# Patient Record
Sex: Male | Born: 1950 | Race: White | Hispanic: No | Marital: Married | Smoking: Never smoker
Health system: Southern US, Community
[De-identification: ages and names within clinical notes are randomized; demographics above are authoritative.]

## PROBLEM LIST (undated history)

## (undated) DIAGNOSIS — F419 Anxiety disorder, unspecified: Secondary | ICD-10-CM

## (undated) HISTORY — PX: NO PAST SURGERIES: SHX2092

---

## 2012-06-04 ENCOUNTER — Other Ambulatory Visit: Payer: Self-pay | Admitting: Orthopedic Surgery

## 2012-06-04 DIAGNOSIS — M542 Cervicalgia: Secondary | ICD-10-CM

## 2012-06-05 ENCOUNTER — Encounter (HOSPITAL_COMMUNITY): Payer: Self-pay | Admitting: General Practice

## 2012-06-05 ENCOUNTER — Ambulatory Visit
Admission: RE | Admit: 2012-06-05 | Discharge: 2012-06-05 | Disposition: A | Discharge status: Home / Self Care | Payer: Managed Care, Other (non HMO) | Source: Ambulatory Visit | Attending: Orthopedic Surgery | Admitting: Orthopedic Surgery

## 2012-06-05 ENCOUNTER — Inpatient Hospital Stay (HOSPITAL_COMMUNITY)
Admission: AD | Admit: 2012-06-05 | Discharge: 2012-06-11 | DRG: 453 | Disposition: A | Discharge status: Home Health | Payer: Managed Care, Other (non HMO) | Source: Ambulatory Visit | Attending: Internal Medicine | Admitting: Internal Medicine

## 2012-06-05 DIAGNOSIS — F419 Anxiety disorder, unspecified: Secondary | ICD-10-CM | POA: Diagnosis present

## 2012-06-05 DIAGNOSIS — M4640 Discitis, unspecified, site unspecified: Secondary | ICD-10-CM | POA: Diagnosis present

## 2012-06-05 DIAGNOSIS — F411 Generalized anxiety disorder: Secondary | ICD-10-CM | POA: Diagnosis present

## 2012-06-05 DIAGNOSIS — B954 Other streptococcus as the cause of diseases classified elsewhere: Secondary | ICD-10-CM | POA: Diagnosis present

## 2012-06-05 DIAGNOSIS — M542 Cervicalgia: Secondary | ICD-10-CM | POA: Diagnosis present

## 2012-06-05 DIAGNOSIS — M5090 Cervical disc disorder, unspecified, unspecified cervical region: Principal | ICD-10-CM | POA: Diagnosis present

## 2012-06-05 DIAGNOSIS — M8618 Other acute osteomyelitis, other site: Secondary | ICD-10-CM | POA: Diagnosis present

## 2012-06-05 DIAGNOSIS — G9340 Encephalopathy, unspecified: Secondary | ICD-10-CM | POA: Diagnosis not present

## 2012-06-05 DIAGNOSIS — G061 Intraspinal abscess and granuloma: Secondary | ICD-10-CM | POA: Diagnosis present

## 2012-06-05 DIAGNOSIS — D649 Anemia, unspecified: Secondary | ICD-10-CM | POA: Diagnosis not present

## 2012-06-05 DIAGNOSIS — J95821 Acute postprocedural respiratory failure: Secondary | ICD-10-CM | POA: Diagnosis not present

## 2012-06-05 HISTORY — DX: Anxiety disorder, unspecified: F41.9

## 2012-06-05 LAB — CBC
HCT: 37.9 % — ABNORMAL LOW (ref 39.0–52.0)
Hemoglobin: 13.4 g/dL (ref 13.0–17.0)
MCHC: 35.4 g/dL (ref 30.0–36.0)
MCV: 87.7 fL (ref 78.0–100.0)
RDW: 12.6 % (ref 11.5–15.5)

## 2012-06-05 LAB — SEDIMENTATION RATE: Sed Rate: 35 mm/h — ABNORMAL HIGH (ref 0–16)

## 2012-06-05 MED ORDER — HYDROCODONE-ACETAMINOPHEN 5-325 MG PO TABS
2.0000 | ORAL_TABLET | Freq: Four times a day (QID) | ORAL | Status: DC | PRN
  Administered 2012-06-05 – 2012-06-06 (×2): 2 via ORAL
  Filled 2012-06-05 (×3): qty 2

## 2012-06-05 MED ORDER — ONDANSETRON HCL 4 MG PO TABS: 4.0000 mg | ORAL_TABLET | Freq: Four times a day (QID) | ORAL | Status: DC | PRN

## 2012-06-05 MED ORDER — ONDANSETRON HCL 4 MG/2ML IJ SOLN: 4.0000 mg | Freq: Four times a day (QID) | INTRAMUSCULAR | Status: DC | PRN

## 2012-06-05 MED ORDER — MAGNESIUM HYDROXIDE 400 MG/5ML PO SUSP
30.0000 mL | Freq: Every day | ORAL | Status: DC | PRN
  Administered 2012-06-05 – 2012-06-06 (×2): 30 mL via ORAL
  Filled 2012-06-05 (×2): qty 30

## 2012-06-05 MED ORDER — DULOXETINE HCL 30 MG PO CPEP
30.0000 mg | ORAL_CAPSULE | Freq: Every day | ORAL | Status: DC
  Administered 2012-06-06 – 2012-06-11 (×4): 30 mg via ORAL
  Filled 2012-06-05 (×7): qty 1

## 2012-06-05 MED ORDER — ACETAMINOPHEN 650 MG RE SUPP: 650.0000 mg | Freq: Four times a day (QID) | RECTAL | Status: DC | PRN

## 2012-06-05 MED ORDER — HYDROMORPHONE HCL PF 1 MG/ML IJ SOLN
1.0000 mg | INTRAMUSCULAR | Status: DC | PRN
  Administered 2012-06-07: 0.5 mg via INTRAVENOUS
  Filled 2012-06-05 (×2): qty 1

## 2012-06-05 MED ORDER — ACETAMINOPHEN 325 MG PO TABS: 650.0000 mg | ORAL_TABLET | Freq: Four times a day (QID) | ORAL | Status: DC | PRN

## 2012-06-05 MED ORDER — OXYCODONE HCL 5 MG PO TABS: 5.0000 mg | ORAL_TABLET | ORAL | Status: DC | PRN

## 2012-06-05 NOTE — H&P (Signed)
[facility] History and Physical  Patrick Richards WUJ:[id number] DOB: 08/16/1951 DOA: 06/05/2012  Referring physician: Charlesetta Shanks - [facility] PCP: Sid Falcon, MD   Chief Complaint: Neck pain  HPI:  61 year old man without any past medical history was referred from orthopedics office for neck pain and a prevertebral fluid collection on the MRI of the C-spine. The patient has had escalating neck pain for the past month. He denies any weakness in his upper extremity or lower extremities. He denies any fevers or chills. He rates the pain 10 out of 10 at times but it is relieved with analgesics  Review of Systems:  As per history of present illness, all other systems reviewed and negative  Past Medical History  Diagnosis Date  . Anxiety     "situational"   Past Surgical History  Procedure Date  . No past surgeries    Social History:  reports that he has never smoked. He has never used smokeless tobacco. He reports that he drinks about 2.4 ounces of alcohol per week. He reports that he does not use illicit drugs. Patient lives with his wife, works  No Known Allergies  No significant family history  Prior to Admission medications   Medication Sig Start Date End Date Taking? Authorizing Provider  DULoxetine (CYMBALTA) 30 MG capsule Take 30 mg by mouth daily.   Yes Historical Provider, MD  HYDROcodone-acetaminophen (NORCO/VICODIN) 5-325 MG per tablet Take 1 tablet by mouth every 6 (six) hours as needed. For pain   Yes Historical Provider, MD   Physical Exam: Filed Vitals:   06/05/12 1649  BP: 163/92  Pulse: 94  Temp: 98.6 F (37 C)  TempSrc: Oral  Resp: 18  SpO2: 100%     General:  Alert and oriented x3  Eyes: Pupil equal round react to light accommodation  ENT: No pharyngeal erythema  Neck: mild cervical  tenderness  Cardiovascular: Regular rate and rhythm  Respiratory: Clear to auscultation bilaterally  Abdomen: Soft nontender  Skin: Warm dry  without rashes  Musculoskeletal: Intact  Psychiatric: Euthymic  Neurologic: Intact  Lab Radiological Exams on Admission: Mr Cervical Spine Wo Contrast  06/05/2012  *RADIOLOGY REPORT*  Clinical Data: Neck pain for 1 month radiating into the left shoulder.  No known injury.  MRI CERVICAL SPINE WITHOUT CONTRAST  Technique:  Multiplanar and multiecho pulse sequences of the cervical spine, to include the craniocervical junction and cervicothoracic junction, were obtained according to standard protocol without intravenous contrast.  Comparison: None.  Findings: There is fluid within the C3-4 disc interspace and endplate destruction consistent with diskitis/osteomyelitis.  No epidural abscess is identified.  The patient has a prevertebral fluid collection consistent with an abscess measuring 2.8 cm transverse by 2.1 cm AP by 2.6 cm cranial-caudal.  There is straightening of the normal cervical lordosis.  The craniocervical junction is normal and cervical cord signal is normal.  C2-3:  Mild disc bulge.  Facet degenerative disease is worse on the left.  Central canal and foramina remain open.  C3-4:  Disc osteophyte complex and uncovertebral disease are identified.  Ventral thecal sac is effaced.  Moderate to moderately severe bilateral foraminal narrowing is identified.  C4-5:  Endplate spur effaces the ventral thecal sac.  Uncovertebral disease causes marked bilateral foraminal narrowing.  C5-6:  Disc bulge effaces the ventral thecal sac.  Uncovertebral disease causes mild to moderate foraminal narrowing.  C6-7:  Small disc osteophyte complex and uncovertebral disease, greater on the left, are seen.  Central canal  and right foramen are open.  Mild left foraminal narrowing noted.  C7-T1:  Mild disc bulge without central canal narrowing.  Foramina appear mildly narrowed.  IMPRESSION:  1.  Findings consistent with diskitis/osteomyelitis at C4-5.  No epidural abscess is identified although there is a prevertebral  abscess as described above. 2.  Degenerative disease as described above.  Critical Value/emergent results were called by telephone at the time of interpretation on 06/05/2012 at 2:10 p.m. to Dr. Renae Fickle, who verbally acknowledged these results.  Original Report Authenticated By: Bernadene Bell. Maricela Curet, M.D.      Assessment/Plan Principal Problem:  *Diskitis Active Problems:  Neck pain  Anxiety   1. C4-C5 diskitis with changes on the MRI suggestive of osteomyelitis and prevertebral abscess. Patient has not had a fever, he has not had systemic symptoms. His course was slow to progress. Will obtain blood cultures, sedimentation rate, CRP. Unless the patient developed neurological symptoms we shall attempt to do an aspiration tomorrow before we start antibiotics. Based on prior experience with interventional radiology services at [facility] the reluctant to aspirate cervical spine. Dr. Renae Fickle has called  Dr. Yevette Edwards and we shall contact him in the morning to see if this region of the cervical spine can be aspirated. Insulin aspart cannot be obtained then the infectious service will recommend empiric antibiotic treatment. I have spoke with Dr. Judyann Munson on call tonight for infectious diseases and we agreed to wait on initiate antibiotics for now  Code Status: full Family Communication: wife at bedside Disposition Plan: home  Time spent: 1 hour  Solenne Manwarren [facility] Pager [phone]  If 7PM-7AM, please contact night-coverage [email or web address] Password [email or web address] 06/05/2012, 7:09 PM

## 2012-06-05 NOTE — Progress Notes (Signed)
Patient admitted to room 5N24 with family at bedside.  Afebrile.  BP 163/92.  Pt/family oriented to room/unit.  Patient is ambulatory, moves all extremities equally, denies any arm/leg pain/weakness/numbness.  Reports mild pain to back of neck.    Dr. Lavera Guise notified of patient's arrival.  Awaiting orders.

## 2012-06-06 ENCOUNTER — Inpatient Hospital Stay (HOSPITAL_COMMUNITY): Payer: Managed Care, Other (non HMO)

## 2012-06-06 ENCOUNTER — Encounter (HOSPITAL_COMMUNITY): Payer: Self-pay | Admitting: Certified Registered"

## 2012-06-06 ENCOUNTER — Other Ambulatory Visit: Payer: Self-pay

## 2012-06-06 ENCOUNTER — Inpatient Hospital Stay (HOSPITAL_COMMUNITY): Payer: Managed Care, Other (non HMO) | Admitting: Certified Registered"

## 2012-06-06 ENCOUNTER — Encounter (HOSPITAL_COMMUNITY)
Admission: AD | Disposition: A | Discharge status: Home Health | Payer: Self-pay | Source: Ambulatory Visit | Attending: Internal Medicine

## 2012-06-06 DIAGNOSIS — M519 Unspecified thoracic, thoracolumbar and lumbosacral intervertebral disc disorder: Secondary | ICD-10-CM

## 2012-06-06 HISTORY — PX: ANTERIOR CERVICAL DECOMP/DISCECTOMY FUSION: SHX1161

## 2012-06-06 LAB — CBC
HCT: 36.9 % — ABNORMAL LOW (ref 39.0–52.0)
Hemoglobin: 12.8 g/dL — ABNORMAL LOW (ref 13.0–17.0)
MCH: 30.8 pg (ref 26.0–34.0)
MCHC: 34.7 g/dL (ref 30.0–36.0)
RDW: 12.9 % (ref 11.5–15.5)

## 2012-06-06 LAB — BODY FLUID CELL COUNT WITH DIFFERENTIAL
Monocyte-Macrophage-Serous Fluid: 3 % — ABNORMAL LOW (ref 50–90)
Neutrophil Count, Fluid: 94 % — ABNORMAL HIGH (ref 0–25)
Total Nucleated Cell Count, Fluid: UNDETERMINED cu mm (ref 0–1000)

## 2012-06-06 LAB — GRAM STAIN

## 2012-06-06 LAB — BASIC METABOLIC PANEL
BUN: 17 mg/dL (ref 6–23)
Chloride: 98 mEq/L (ref 96–112)
Creatinine, Ser: 0.85 mg/dL (ref 0.50–1.35)
GFR calc Af Amer: >90 mL/min (ref >90)
GFR calc non Af Amer: >90 mL/min (ref >90)
Glucose, Bld: 102 mg/dL — ABNORMAL HIGH (ref 70–99)
Potassium: 4 mEq/L (ref 3.5–5.1)

## 2012-06-06 LAB — C-REACTIVE PROTEIN: CRP: 3 mg/dL — ABNORMAL HIGH (ref <0.60)

## 2012-06-06 SURGERY — ANTERIOR CERVICAL DECOMPRESSION/DISCECTOMY FUSION 1 LEVEL
Anesthesia: General | Site: Neck | Wound class: Dirty or Infected

## 2012-06-06 MED ORDER — FENTANYL CITRATE 0.05 MG/ML IJ SOLN
INTRAMUSCULAR | Status: DC | PRN
  Administered 2012-06-06 (×6): 50 ug via INTRAVENOUS
  Administered 2012-06-06: 250 ug via INTRAVENOUS

## 2012-06-06 MED ORDER — LACTATED RINGERS IV SOLN
INTRAVENOUS | Status: DC
  Administered 2012-06-06 – 2012-06-09 (×2): via INTRAVENOUS

## 2012-06-06 MED ORDER — BUPIVACAINE-EPINEPHRINE 0.25% -1:200000 IJ SOLN
INTRAMUSCULAR | Status: DC | PRN
  Administered 2012-06-06: 2 mL

## 2012-06-06 MED ORDER — NEOSTIGMINE METHYLSULFATE 1 MG/ML IJ SOLN
INTRAMUSCULAR | Status: DC | PRN
  Administered 2012-06-06: 5 mg via INTRAVENOUS

## 2012-06-06 MED ORDER — 0.9 % SODIUM CHLORIDE (POUR BTL) OPTIME
TOPICAL | Status: DC | PRN
  Administered 2012-06-06: 1000 mL

## 2012-06-06 MED ORDER — GLYCOPYRROLATE 0.2 MG/ML IJ SOLN
INTRAMUSCULAR | Status: DC | PRN
  Administered 2012-06-06: .6 mg via INTRAVENOUS

## 2012-06-06 MED ORDER — VANCOMYCIN HCL IN DEXTROSE 1-5 GM/200ML-% IV SOLN
INTRAVENOUS | Status: AC
  Filled 2012-06-06: qty 200

## 2012-06-06 MED ORDER — THROMBIN 20000 UNITS EX KIT
PACK | CUTANEOUS | Status: DC | PRN
  Administered 2012-06-06: 18:00:00 via TOPICAL

## 2012-06-06 MED ORDER — PROMETHAZINE HCL 25 MG/ML IJ SOLN: 6.2500 mg | INTRAMUSCULAR | Status: DC | PRN

## 2012-06-06 MED ORDER — OXYCODONE HCL 5 MG PO TABS
5.0000 mg | ORAL_TABLET | ORAL | Status: DC | PRN
  Administered 2012-06-06: 5 mg via ORAL
  Filled 2012-06-06: qty 1

## 2012-06-06 MED ORDER — ONDANSETRON HCL 4 MG/2ML IJ SOLN
INTRAMUSCULAR | Status: DC | PRN
  Administered 2012-06-06 (×2): 4 mg via INTRAVENOUS

## 2012-06-06 MED ORDER — VANCOMYCIN HCL 1000 MG IV SOLR
750.0000 mg | Freq: Three times a day (TID) | INTRAVENOUS | Status: DC
  Filled 2012-06-06 (×2): qty 750

## 2012-06-06 MED ORDER — MEPERIDINE HCL 50 MG/ML IJ SOLN: 6.2500 mg | INTRAMUSCULAR | Status: DC | PRN

## 2012-06-06 MED ORDER — HYDROMORPHONE HCL PF 1 MG/ML IJ SOLN
0.2500 mg | INTRAMUSCULAR | Status: DC | PRN
Start: 2012-06-06 — End: 2012-06-06
  Administered 2012-06-06 (×2): 0.5 mg via INTRAVENOUS

## 2012-06-06 MED ORDER — LACTATED RINGERS IV SOLN
INTRAVENOUS | Status: DC | PRN
  Administered 2012-06-06 (×4): via INTRAVENOUS

## 2012-06-06 MED ORDER — VANCOMYCIN HCL 1000 MG IV SOLR
1000.0000 mg | INTRAVENOUS | Status: DC | PRN
  Administered 2012-06-06: 1000 mg via INTRAVENOUS

## 2012-06-06 MED ORDER — ROCURONIUM BROMIDE 100 MG/10ML IV SOLN
INTRAVENOUS | Status: DC | PRN
  Administered 2012-06-06: 10 mg via INTRAVENOUS
  Administered 2012-06-06: 20 mg via INTRAVENOUS
  Administered 2012-06-06: 50 mg via INTRAVENOUS

## 2012-06-06 MED ORDER — MIDAZOLAM HCL 2 MG/2ML IJ SOLN
INTRAMUSCULAR | Status: AC
  Filled 2012-06-06: qty 2

## 2012-06-06 MED ORDER — HYDROMORPHONE HCL PF 1 MG/ML IJ SOLN
INTRAMUSCULAR | Status: AC
  Filled 2012-06-06: qty 1

## 2012-06-06 MED ORDER — MIDAZOLAM HCL 5 MG/5ML IJ SOLN
INTRAMUSCULAR | Status: DC | PRN
  Administered 2012-06-06: 2 mg via INTRAVENOUS

## 2012-06-06 MED ORDER — VANCOMYCIN HCL 1000 MG IV SOLR
750.0000 mg | Freq: Three times a day (TID) | INTRAVENOUS | Status: DC
  Administered 2012-06-07 – 2012-06-09 (×7): 750 mg via INTRAVENOUS
  Filled 2012-06-06 (×9): qty 750

## 2012-06-06 MED ORDER — DEXTROSE 5 % IV SOLN
2.0000 g | INTRAVENOUS | Status: DC
  Filled 2012-06-06: qty 2

## 2012-06-06 MED ORDER — BACITRACIN ZINC 500 UNIT/GM EX OINT
TOPICAL_OINTMENT | CUTANEOUS | Status: AC
  Filled 2012-06-06: qty 15

## 2012-06-06 MED ORDER — PROPOFOL 10 MG/ML IV EMUL
INTRAVENOUS | Status: DC | PRN
  Administered 2012-06-06: 200 mg via INTRAVENOUS

## 2012-06-06 MED ORDER — MIDAZOLAM HCL 2 MG/2ML IJ SOLN: 0.5000 mg | Freq: Once | INTRAMUSCULAR | Status: DC | PRN

## 2012-06-06 SURGICAL SUPPLY — 75 items
BENZOIN TINCTURE PRP APPL 2/3 (GAUZE/BANDAGES/DRESSINGS) ×2 IMPLANT
BIT DRILL NEURO 2X3.1 SFT TUCH (MISCELLANEOUS) ×1 IMPLANT
BLADE AVERAGE 25X9 (BLADE) ×2 IMPLANT
BLADE SURG 15 STRL LF DISP TIS (BLADE) ×1 IMPLANT
BLADE SURG 15 STRL SS (BLADE) ×1
BLADE SURG ROTATE 9660 (MISCELLANEOUS) ×2 IMPLANT
BUR MATCHSTICK NEURO 3.0 LAGG (BURR) ×2 IMPLANT
CARTRIDGE OIL MAESTRO DRILL (MISCELLANEOUS) ×1 IMPLANT
CLOTH BEACON ORANGE TIMEOUT ST (SAFETY) ×2 IMPLANT
CLSR STERI-STRIP ANTIMIC 1/2X4 (GAUZE/BANDAGES/DRESSINGS) ×2 IMPLANT
COLLAR CERV LO CONTOUR FIRM DE (SOFTGOODS) IMPLANT
CORDS BIPOLAR (ELECTRODE) ×2 IMPLANT
COVER MAYO STAND STRL (DRAPES) ×2 IMPLANT
COVER SURGICAL LIGHT HANDLE (MISCELLANEOUS) ×2 IMPLANT
CRADLE DONUT ADULT HEAD (MISCELLANEOUS) ×2 IMPLANT
DIFFUSER DRILL AIR PNEUMATIC (MISCELLANEOUS) ×2 IMPLANT
DRAIN JACKSON RD 7FR 3/32 (WOUND CARE) ×2 IMPLANT
DRAPE C-ARM 42X72 X-RAY (DRAPES) ×2 IMPLANT
DRAPE POUCH INSTRU U-SHP 10X18 (DRAPES) ×2 IMPLANT
DRAPE SURG 17X23 STRL (DRAPES) ×6 IMPLANT
DRILL NEURO 2X3.1 SOFT TOUCH (MISCELLANEOUS) ×2
DURAPREP 26ML APPLICATOR (WOUND CARE) ×2 IMPLANT
ELECT COATED BLADE 2.86 ST (ELECTRODE) ×2 IMPLANT
ELECT REM PT RETURN 9FT ADLT (ELECTROSURGICAL) ×2
ELECTRODE REM PT RTRN 9FT ADLT (ELECTROSURGICAL) ×1 IMPLANT
EVACUATOR SILICONE 100CC (DRAIN) ×2 IMPLANT
GAUZE SPONGE 4X4 16PLY XRAY LF (GAUZE/BANDAGES/DRESSINGS) ×2 IMPLANT
GLOVE BIO SURGEON STRL SZ8 (GLOVE) ×2 IMPLANT
GLOVE BIOGEL M 7.0 STRL (GLOVE) ×4 IMPLANT
GLOVE BIOGEL PI IND STRL 7.5 (GLOVE) ×2 IMPLANT
GLOVE BIOGEL PI IND STRL 8 (GLOVE) ×2 IMPLANT
GLOVE BIOGEL PI INDICATOR 7.5 (GLOVE) ×2
GLOVE BIOGEL PI INDICATOR 8 (GLOVE) ×2
GOWN SRG XL XLNG 56XLVL 4 (GOWN DISPOSABLE) ×3 IMPLANT
GOWN STRL NON-REIN LRG LVL3 (GOWN DISPOSABLE) ×2 IMPLANT
GOWN STRL NON-REIN XL XLG LVL4 (GOWN DISPOSABLE) ×3
HANDPIECE INTERPULSE COAX TIP (DISPOSABLE) ×1
IV CATH 14GX2 1/4 (CATHETERS) ×2 IMPLANT
KIT BASIN OR (CUSTOM PROCEDURE TRAY) ×2 IMPLANT
KIT ROOM TURNOVER OR (KITS) ×2 IMPLANT
MANIFOLD NEPTUNE II (INSTRUMENTS) IMPLANT
NEEDLE 27GAX1X1/2 (NEEDLE) ×2 IMPLANT
NEEDLE SPNL 20GX3.5 QUINCKE YW (NEEDLE) ×2 IMPLANT
NS IRRIG 1000ML POUR BTL (IV SOLUTION) ×2 IMPLANT
OIL CARTRIDGE MAESTRO DRILL (MISCELLANEOUS) ×2
PACK ORTHO CERVICAL (CUSTOM PROCEDURE TRAY) ×2 IMPLANT
PAD ARMBOARD 7.5X6 YLW CONV (MISCELLANEOUS) ×4 IMPLANT
PATTIES SURGICAL .5 X.5 (GAUZE/BANDAGES/DRESSINGS) ×2 IMPLANT
PATTIES SURGICAL .5 X1 (DISPOSABLE) ×2 IMPLANT
PIN DISTRACTOR 14MM (PIN) ×4 IMPLANT
PLATE VECTRA 48MM (Plate) ×2 IMPLANT
SCREW 4.0X16MM (Screw) ×8 IMPLANT
SCREW CORTEX 1.0X10MM (Screw) ×2 IMPLANT
SET HNDPC FAN SPRY TIP SCT (DISPOSABLE) ×1 IMPLANT
SPONGE GAUZE 4X4 12PLY (GAUZE/BANDAGES/DRESSINGS) ×2 IMPLANT
SPONGE INTESTINAL PEANUT (DISPOSABLE) ×12 IMPLANT
SPONGE SURGIFOAM ABS GEL 100 (HEMOSTASIS) ×2 IMPLANT
STRIP CLOSURE SKIN 1/2X4 (GAUZE/BANDAGES/DRESSINGS) ×2 IMPLANT
SURGIFLO TRUKIT (HEMOSTASIS) IMPLANT
SUT MNCRL AB 4-0 PS2 18 (SUTURE) ×2 IMPLANT
SUT SILK 4 0 (SUTURE)
SUT SILK 4-0 18XBRD TIE 12 (SUTURE) IMPLANT
SUT VIC AB 1 CT1 27 (SUTURE) ×1
SUT VIC AB 1 CT1 27XBRD ANBCTR (SUTURE) ×1 IMPLANT
SUT VIC AB 2-0 CT2 18 VCP726D (SUTURE) ×2 IMPLANT
SYR BULB IRRIGATION 50ML (SYRINGE) ×2 IMPLANT
SYR CONTROL 10ML LL (SYRINGE) ×4 IMPLANT
SYR TB 1ML LUER SLIP (SYRINGE) ×2 IMPLANT
TAPE CLOTH 4X10 WHT NS (GAUZE/BANDAGES/DRESSINGS) IMPLANT
TAPE CLOTH SURG 4X10 WHT LF (GAUZE/BANDAGES/DRESSINGS) ×2 IMPLANT
TAPE UMBILICAL COTTON 1/8X30 (MISCELLANEOUS) ×2 IMPLANT
TOWEL OR 17X24 6PK STRL BLUE (TOWEL DISPOSABLE) ×2 IMPLANT
TOWEL OR 17X26 10 PK STRL BLUE (TOWEL DISPOSABLE) ×2 IMPLANT
WATER STERILE IRR 1000ML POUR (IV SOLUTION) ×2 IMPLANT
YANKAUER SUCT BULB TIP NO VENT (SUCTIONS) ×2 IMPLANT

## 2012-06-06 NOTE — Preoperative (Signed)
Beta Blockers   Reason not to administer Beta Blockers:Not Applicable 

## 2012-06-06 NOTE — Anesthesia Postprocedure Evaluation (Signed)
  Anesthesia Post-op Note  Patient: Patrick Richards  Procedure(s) Performed: Procedure(s) (LRB): ANTERIOR CERVICAL DECOMPRESSION/DISCECTOMY FUSION 1 LEVEL (N/A)  Patient Location: PACU  Anesthesia Type: General  Level of Consciousness: awake, alert , sedated and patient cooperative  Airway and Oxygen Therapy: Patient Spontanous Breathing and Patient connected to nasal cannula oxygen  Post-op Pain: mild  Post-op Assessment: Post-op Vital signs reviewed, Patient's Cardiovascular Status Stable, Respiratory Function Stable, Patent Airway, No signs of Nausea or vomiting and Pain level controlled  Post-op Vital Signs: Reviewed and stable  Complications: No apparent anesthesia complications

## 2012-06-06 NOTE — Progress Notes (Signed)
Utilization review completed. Brieanna Nau, RN, BSN. 

## 2012-06-06 NOTE — Transfer of Care (Signed)
Immediate Anesthesia Transfer of Care Note  Patient: Patrick Richards  Procedure(s) Performed: Procedure(s) (LRB): ANTERIOR CERVICAL DECOMPRESSION/DISCECTOMY FUSION 1 LEVEL (N/A)  Patient Location: PACU  Anesthesia Type: General  Level of Consciousness: awake, alert  and oriented  Airway & Oxygen Therapy: Patient Spontanous Breathing and Patient connected to face mask oxygen  Post-op Assessment: Report given to PACU RN and Post -op Vital signs reviewed and stable  Post vital signs: Reviewed and stable  Complications: No apparent anesthesia complications

## 2012-06-06 NOTE — Anesthesia Preprocedure Evaluation (Addendum)
Anesthesia Evaluation  Patient identified by MRN, date of birth, ID band Patient awake    Reviewed: Allergy & Precautions, H&P , NPO status , Patient's Chart, lab work & pertinent test results  History of Anesthesia Complications Negative for: history of anesthetic complications  Airway Mallampati: I TM Distance: >3 FB Neck ROM: Full    Dental No notable dental hx. (+) Teeth Intact and Dental Advisory Given   Pulmonary neg pulmonary ROS,  breath sounds clear to auscultation  Pulmonary exam normal       Cardiovascular negative cardio ROS  Rhythm:Regular Rate:Normal     Neuro/Psych Anxiety    GI/Hepatic negative GI ROS, Neg liver ROS,   Endo/Other  negative endocrine ROS  Renal/GU negative Renal ROS     Musculoskeletal   Abdominal   Peds  Hematology negative hematology ROS (+)   Anesthesia Other Findings   Reproductive/Obstetrics                          Anesthesia Physical Anesthesia Plan  ASA: I  Anesthesia Plan: General   Post-op Pain Management:    Induction: Intravenous  Airway Management Planned: Oral ETT  Additional Equipment:   Intra-op Plan:   Post-operative Plan: Extubation in OR  Informed Consent: I have reviewed the patients History and Physical, chart, labs and discussed the procedure including the risks, benefits and alternatives for the proposed anesthesia with the patient or authorized representative who has indicated his/her understanding and acceptance.   Dental advisory given  Plan Discussed with: CRNA and Surgeon  Anesthesia Plan Comments: (Plan routine monitors, GETA)        Anesthesia Quick Evaluation

## 2012-06-06 NOTE — Progress Notes (Signed)
[facility] PROGRESS NOTE  Patrick Richards ZOX:[id number] DOB: 1951/05/05 DOA: 06/05/2012 PCP: Patrick Falcon, MD  Assessment/Plan: Principal Problem:  *Diskitis Active Problems:  Neck pain  Anxiety  1. Discitis at C4-5 with prevertebral suspected abscess. Discussed with spinal surgery and given location and need for cultures, too risky with interventional radiology. Patient is to go to the OR today for fluid removal and possible bone extraction. Once cultures are sent, can start on broad-spectrum antibiotics. Infectious disease is following as well. Certainly concerning and causes unusual and unknown at this time. Patient is critically at risk for severe systemic septicemia, secondary endocarditis or neurologic dysfunction at the cervical level. 2. Neck pain: Secondary to #1. Pain control. 3. When necessary benzodiazepines for anxiety.  Code Status: Full code Family Communication: Spoke with patient's wife was at bedside today Disposition Plan: Depending on outcome, possibly and hopefully can go home   Brief narrative: 61 year old white male with no past medical history who presents with 2 days of neck pain and found to have a discitis at the cervical level with secondary fluid collection suspected to be an abscess. After extensive discussion with infectious disease and spinal surgery, holding off on broad-spectrum antibiotics until after fluid can be extracted and sent for cultures. To be the immediate moment, the patient is stable with no neurologic dysfunction yet because of this abscess  Consultants:  Patrick Richards-infectious disease  Patrick Richards-orthopedic spinal surgery  Procedures:  Scheduled for surgery today for fluid extraction  Antibiotics:  None yet until after surgery  HPI/Subjective: Patient complains of some mild neck pain with, radiant at about a 4/10. No focal weakness in any of his extremities. No other issues.  Objective: Filed Vitals:   06/05/12 1649 06/05/12  2155 06/06/12 0544  BP: 163/92 135/71 112/94  Pulse: 94 85 65  Temp: 98.6 F (37 C) 98.6 F (37 C) 98.6 F (37 C)  TempSrc: Oral Oral   Resp: 18 20 19   SpO2: 100% 100% 98%    Intake/Output Summary (Last 24 hours) at 06/06/12 1547 Last data filed at 06/06/12 1300  Gross per 24 hour  Intake    480 ml  Output      0 ml  Net    480 ml    Exam:   General: Alert and oriented x3, no acute distress, fatigued, looks slightly older than stated age  HEENT: Normocephalic and make dramatic, mucous members are slightly dry, cranial nerves II through XII are intact, neck is supple, no JVD or thyromegaly  Cardiovascular: Regular rate and rhythm, S1-S2  Lungs: Clear to auscultation bilaterally  Abdomen: Soft, nontender, nondistended, positive bowel sounds  : Extremities: No clubbing or cyanosis or edema  Data Reviewed: Basic Metabolic Panel:  Lab 06/06/12 8119  NA 140  K 4.0  CL 98  CO2 31  GLUCOSE 102*  BUN 17  CREATININE 0.85  CALCIUM 9.2  MG --  PHOS --   CBC:  Lab 06/06/12 0550 06/05/12 1956  WBC 11.8* 11.3*  NEUTROABS -- --  HGB 12.8* 13.4  HCT 36.9* 37.9*  MCV 88.9 87.7  PLT 351 378     Studies: Ct Cervical Spine Wo Contrast  06/06/2012  IMPRESSION:  1.  No gross change in the findings at C4-C5 concerning for diskitis, osteomyelitis and prevertebral abscess formation as correlated with MRI performed yesterday. 2.  Of note, paravertebral and epidural soft tissue evaluation is limited by noncontrast CT.  Mr Cervical Spine Wo Contrast 06/05/2012    IMPRESSION:  1.  Findings consistent with diskitis/osteomyelitis at C4-5.  No epidural abscess is identified although there is a prevertebral abscess as described above. 2.  Degenerative disease as described above.    Scheduled Meds:   . DULoxetine  30 mg Oral Daily   Continuous Infusions:   . lactated ringers 50 mL/hr at 06/06/12 1534    Time spent: 40 minutes  Patrick Richards  [facility] Pager  [phone]. If 8PM-8AM, please contact night-coverage at [email or web address], password [facility] 06/06/2012, 3:47 PM  LOS: 1 day

## 2012-06-06 NOTE — Progress Notes (Signed)
Just completed I and D of C4 and C5 vertebral bodies with corpectomy and strut grafting and anterior instrumentation. Started vancomycin intraoperatively.  Plan is to maintain patient in hard cervical collar and return to OR tomorrow for posterior cervical fusion with instrumentation.    - additional abx per ID recs  - full operative report to follow

## 2012-06-06 NOTE — Progress Notes (Signed)
ANTIBIOTIC CONSULT NOTE - INITIAL  Pharmacy Consult for Vancomycin Indication: Discitis; r/o infection of spine/lumbar space   No Known Allergies  Patient Measurements:   Height : 6'1" Weight : 74.8 kg IBW: 80 kg  Vital Signs:   Intake/Output from previous day: 08/06 0701 - 08/07 0700 In: 480 [P.O.:480] Out: -  Intake/Output from this shift: Total I/O In: 1000 [I.V.:1000] Out: -   Labs:  Basename 06/06/12 0550 06/05/12 1956  WBC 11.8* 11.3*  HGB 12.8* 13.4  PLT 351 378  LABCREA -- --  CREATININE 0.85 --   CrCl ~ 96 ml/min   Microbiology: Recent Results (from the past 720 hour(s))  GRAM STAIN     Status: Normal   Collection Time   06/06/12  5:12 PM      Component Value Range Status Comment   Specimen Description WOUND ASPIRATE NECK   Final    Special Requests NONE   Final    Gram Stain     Final    Value: ABUNDANT WBC PRESENT,BOTH PMN AND MONONUCLEAR     ABUNDANT GRAM POSITIVE COCCI IN PAIRS IN CHAINS   Report Status 06/06/2012 FINAL   Final     Medical History: Past Medical History  Diagnosis Date  . Anxiety     "situational"    Medications:  Prescriptions prior to admission  Medication Sig Dispense Refill  . DULoxetine (CYMBALTA) 30 MG capsule Take 30 mg by mouth daily.      Marland Kitchen HYDROcodone-acetaminophen (NORCO/VICODIN) 5-325 MG per tablet Take 1 tablet by mouth every 6 (six) hours as needed. For pain        Assessment: 61 yo M with neck pain for the past month.  Outpt evaluation revealed a fluid collection in the c-spine area.  Pt admitted 8/6 and went to the OR 8/7 for fluid removal and I&D of abscess.  Cultures were obtained during the procedure and the patient is to be started on broad-spectrum antibiotics.    Pt is afebrile with slightly elevated WBC.  Good renal function with CrCl ~ 96 ml/min.   Pt received 1gm IV Vancomycin ~ 1730 in the OR after cultures obtained.  Pt also started on Rocephin 2gm IV Q24 hours.  Goal of Therapy:  Vancomycin  trough level 15-20 mcg/ml  Plan:  Begin Vancomycin 750 mg IV Q8h.  Next dose due midnight. Follow up culture data, renal function, and clinical progress. Check Vancomycin trough at steady state.  [employer], Pharm.D., BCPS Clinical Pharmacist Pager [phone] 06/06/2012 8:33 PM

## 2012-06-06 NOTE — H&P (Signed)
PREOPERATIVE H&P  Chief Complaint: neck pain  HPI: Patrick Richards is a 61 y.o. male with c45 discitis.  Full H and P has been dictated.  Please refer to that H and P for a full account of this patient's history.  Other changes in H and P since then.  Past Medical History  Diagnosis Date  . Anxiety     "situational"   Past Surgical History  Procedure Date  . No past surgeries    History   Social History  . Marital Status: Married    Spouse Name: N/A    Number of Children: N/A  . Years of Education: N/A   Social History Main Topics  . Smoking status: Never Smoker   . Smokeless tobacco: Never Used  . Alcohol Use: 2.4 oz/week    4 Cans of beer per week  . Drug Use: No  . Sexually Active: No   Other Topics Concern  . None   Social History Narrative  . None   History reviewed. No pertinent family history. No Known Allergies Prior to Admission medications   Medication Sig Start Date End Date Taking? Authorizing Provider  DULoxetine (CYMBALTA) 30 MG capsule Take 30 mg by mouth daily.   Yes Historical Provider, MD  HYDROcodone-acetaminophen (NORCO/VICODIN) 5-325 MG per tablet Take 1 tablet by mouth every 6 (six) hours as needed. For pain   Yes Historical Provider, MD     All other systems have been reviewed and were otherwise negative with the exception of those mentioned in the HPI and as above.  Physical Exam: Filed Vitals:   06/06/12 0544  BP: 112/94  Pulse: 65  Temp: 98.6 F (37 C)  Resp: 19    General: Alert, no acute distress Cardiovascular: No pedal edema Respiratory: No cyanosis, no use of accessory musculature Skin: No lesions in the area of chief complaint Neurologic: Sensation intact distally Psychiatric: Patient is competent for consent with normal mood and affect Lymphatic: No axillary or cervical lymphadenopathy    Assessment/Plan: C4/5 discitis with abcess Plan for Procedure(s): C4/C5 corpectomy, I and D, allograft placement and anterior  instrumentation  (Full consult has been dictated)   Emilee Hero, MD 06/06/2012 4:03 PM

## 2012-06-06 NOTE — Anesthesia Procedure Notes (Signed)
Procedure Name: Intubation Date/Time: 06/06/2012 4:19 PM Performed by: Glendora Score A Pre-anesthesia Checklist: Patient identified, Emergency Drugs available, Suction available and Patient being monitored Patient Re-evaluated:Patient Re-evaluated prior to inductionOxygen Delivery Method: Circle system utilized Preoxygenation: Pre-oxygenation with 100% oxygen Intubation Type: IV induction Ventilation: Mask ventilation without difficulty and Oral airway inserted - appropriate to patient size Laryngoscope Size: Hyacinth Meeker and 2 Grade View: Grade I Tube type: Oral Tube size: 7.0 mm Number of attempts: 1 Airway Equipment and Method: Stylet Placement Confirmation: ETT inserted through vocal cords under direct vision,  positive ETCO2 and breath sounds checked- equal and bilateral Secured at: 22 cm Tube secured with: Tape Dental Injury: Teeth and Oropharynx as per pre-operative assessment

## 2012-06-06 NOTE — H&P (Signed)
[facility]     Reason for Consult:  Prevertebral Abscess and diskitis in cervical spine   Referring Physician: Dr. Hollice Espy  Principal Problem:  *Diskitis Active Problems:  Neck pain  Anxiety      . DULoxetine  30 mg Oral Daily    Recommendations: 1) Start Vancomycin 750mg  IV TID and Rocephin 2gm IV q24h 2) f/u with wound and blood cultures  Assessment: [name] is 61 yo white male with chronic neck pain who presented with C4-C5 diskitis with changes on the MRI suggestive of osteomyelitis and prevertebral abscess. He had no fever, no systemic symptoms. Blood cultures are still pending. His CRP was 3 and ESR of 35 with a WBC of 11.8. Would hold the antibiotic treatment for now. He scheduled for  C4/C5 corpectomy, I and D, allograft placement and anterior instrumentation this afternoon. His wound aspirate gram stain showed abundant WBC both PMN and mononuclear with abundant gram positive cocci in pairs. Would start on Vancomycin and rocephin for broad coverage of gram positive cocci. Would follow up with his wound cultures and blood cultures.      HPI: Patrick Richards is a 61 y.o. male with no significant past medical history was referred from orthopedic office for neck pain and a prevertebral abscess on MRI of C-spine. He complaints of having this neck pain for the past one month. He tried using tylenol, tramadol, steroids with no improvement in his pain. He had this neck pain for years but it was on/off got better with tylenol and pain medications, but this time the pain was getting worse and had no relief with medications. At present, he complaints of neck pain more on the right side of neck, 8/10 in intensity, non radiating, aggravates on movement of neck and relieves on rest. He denies any tingling, numbness, weakness of upper extremities. He denies any fever, chills, cough, abdominal pain, n/v/d, backache, any joint pains, rash.   Review of  Systems: Pertinent items are noted in HPI.  Past Medical History  Diagnosis Date  . Anxiety     "situational"    History  Substance Use Topics  . Smoking status: Never Smoker   . Smokeless tobacco: Never Used  . Alcohol Use: 2.4 oz/week    4 Cans of beer per week    History reviewed. No pertinent family history. No Known Allergies  OBJECTIVE: Blood pressure 112/94, pulse 65, temperature 98.6 F (37 C), temperature source Oral, resp. rate 19, SpO2 98.00%. General: A&OX3, lying on bed Skin: warm Lungs: CTAB Cor: RRR, No murmurs, rubs, gallops Abdomen: Soft, non distended, non tender, bs+ Neck : no redness/swelling, Restricted movements Extremities: pulses 2+ b/l,  Neuro: Strength 5/5 throughout, sensation intact to soft touch  Microbiology: No results found for this or any previous visit (from the past 240 hour(s)).  Would discuss the plan with Dr. Karlton Lemon,  Medical Student  06/06/2012, 10:27 AM  Infectious Diseases Attending Note Date: 06/06/2012  Patient name: Patrick Richards  Medical record number: [id number]  Date of birth: April 22, 1951    This patient has been seen and discussed with the house staff. Please see their note for complete details. I concur with their findings with the following additions/corrections:   Patient with prevertebral abscess and diskitis/vertebral osteomyelitis of cervical spine now sp Spinal surgery. Intraop cultures showing GPC pairs and chains.  We will start vancomycin and rocephin. Thanks so much for holding off on antibiotics so  that we could get a good culture to guide what will be protracted therapy.  Acey Lav 06/06/2012, 7:14 PM

## 2012-06-07 ENCOUNTER — Encounter (HOSPITAL_COMMUNITY)
Admission: AD | Disposition: A | Discharge status: Home Health | Payer: Self-pay | Source: Ambulatory Visit | Attending: Internal Medicine

## 2012-06-07 ENCOUNTER — Inpatient Hospital Stay (HOSPITAL_COMMUNITY): Payer: Managed Care, Other (non HMO)

## 2012-06-07 ENCOUNTER — Encounter (HOSPITAL_COMMUNITY): Payer: Self-pay | Admitting: Certified Registered Nurse Anesthetist

## 2012-06-07 ENCOUNTER — Encounter (HOSPITAL_COMMUNITY): Payer: Self-pay | Admitting: Anesthesiology

## 2012-06-07 ENCOUNTER — Inpatient Hospital Stay (HOSPITAL_COMMUNITY): Payer: Managed Care, Other (non HMO) | Admitting: Anesthesiology

## 2012-06-07 DIAGNOSIS — J95821 Acute postprocedural respiratory failure: Secondary | ICD-10-CM

## 2012-06-07 DIAGNOSIS — M519 Unspecified thoracic, thoracolumbar and lumbosacral intervertebral disc disorder: Secondary | ICD-10-CM

## 2012-06-07 DIAGNOSIS — G9340 Encephalopathy, unspecified: Secondary | ICD-10-CM

## 2012-06-07 DIAGNOSIS — J9589 Other postprocedural complications and disorders of respiratory system, not elsewhere classified: Secondary | ICD-10-CM

## 2012-06-07 DIAGNOSIS — M542 Cervicalgia: Secondary | ICD-10-CM

## 2012-06-07 HISTORY — PX: POSTERIOR CERVICAL FUSION/FORAMINOTOMY: SHX5038

## 2012-06-07 SURGERY — POSTERIOR CERVICAL FUSION/FORAMINOTOMY LEVEL 4
Anesthesia: General | Site: Neck | Wound class: Clean

## 2012-06-07 MED ORDER — METHOCARBAMOL 100 MG/ML IJ SOLN
500.0000 mg | Freq: Four times a day (QID) | INTRAMUSCULAR | Status: DC
  Administered 2012-06-08 – 2012-06-11 (×13): 500 mg via INTRAVENOUS
  Filled 2012-06-07 (×22): qty 5

## 2012-06-07 MED ORDER — DIAZEPAM 5 MG PO TABS
5.0000 mg | ORAL_TABLET | Freq: Four times a day (QID) | ORAL | Status: DC | PRN
  Administered 2012-06-08: 5 mg via ORAL
  Filled 2012-06-07: qty 1

## 2012-06-07 MED ORDER — ACETAMINOPHEN 10 MG/ML IV SOLN
INTRAVENOUS | Status: AC
  Filled 2012-06-07: qty 100

## 2012-06-07 MED ORDER — OXYCODONE HCL 10 MG PO TB12
10.0000 mg | ORAL_TABLET | Freq: Two times a day (BID) | ORAL | Status: DC
  Administered 2012-06-08 – 2012-06-10 (×5): 10 mg via ORAL
  Filled 2012-06-07 (×6): qty 1

## 2012-06-07 MED ORDER — LACTATED RINGERS IV SOLN
INTRAVENOUS | Status: DC | PRN
  Administered 2012-06-07 (×3): via INTRAVENOUS

## 2012-06-07 MED ORDER — MIDAZOLAM HCL 5 MG/5ML IJ SOLN
INTRAMUSCULAR | Status: DC | PRN
  Administered 2012-06-07 (×2): 2 mg via INTRAVENOUS

## 2012-06-07 MED ORDER — PANTOPRAZOLE SODIUM 40 MG IV SOLR
40.0000 mg | Freq: Every day | INTRAVENOUS | Status: DC
  Administered 2012-06-08 (×2): 40 mg via INTRAVENOUS
  Filled 2012-06-07 (×3): qty 40

## 2012-06-07 MED ORDER — BIOTENE DRY MOUTH MT LIQD
15.0000 mL | Freq: Four times a day (QID) | OROMUCOSAL | Status: DC
  Administered 2012-06-08 (×2): 15 mL via OROMUCOSAL

## 2012-06-07 MED ORDER — PROPOFOL 10 MG/ML IV EMUL
INTRAVENOUS | Status: DC | PRN
  Administered 2012-06-07: 80 mg via INTRAVENOUS
  Administered 2012-06-07: 180 mg via INTRAVENOUS
  Administered 2012-06-07: 20 mg via INTRAVENOUS

## 2012-06-07 MED ORDER — CEFTRIAXONE SODIUM 2 G IJ SOLR
2.0000 g | INTRAMUSCULAR | Status: DC
  Administered 2012-06-08 – 2012-06-10 (×3): 2 g via INTRAVENOUS
  Filled 2012-06-07 (×6): qty 2

## 2012-06-07 MED ORDER — BUPIVACAINE-EPINEPHRINE 0.25% -1:200000 IJ SOLN
INTRAMUSCULAR | Status: DC | PRN
  Administered 2012-06-07: 6 mL

## 2012-06-07 MED ORDER — ALBUMIN HUMAN 5 % IV SOLN
INTRAVENOUS | Status: DC | PRN
  Administered 2012-06-07 (×2): via INTRAVENOUS

## 2012-06-07 MED ORDER — PROMETHAZINE HCL 25 MG/ML IJ SOLN: 6.2500 mg | INTRAMUSCULAR | Status: DC | PRN

## 2012-06-07 MED ORDER — HYDROMORPHONE HCL PF 1 MG/ML IJ SOLN: 0.2500 mg | INTRAMUSCULAR | Status: DC | PRN

## 2012-06-07 MED ORDER — GLYCOPYRROLATE 0.2 MG/ML IJ SOLN
INTRAMUSCULAR | Status: DC | PRN
  Administered 2012-06-07: .8 mg via INTRAVENOUS

## 2012-06-07 MED ORDER — ACETAMINOPHEN 10 MG/ML IV SOLN
INTRAVENOUS | Status: DC | PRN
  Administered 2012-06-07: 1000 mg via INTRAVENOUS

## 2012-06-07 MED ORDER — VECURONIUM BROMIDE 10 MG IV SOLR
INTRAVENOUS | Status: DC | PRN
  Administered 2012-06-07 (×6): 2 mg via INTRAVENOUS

## 2012-06-07 MED ORDER — THROMBIN 20000 UNITS EX KIT
PACK | CUTANEOUS | Status: DC | PRN
  Administered 2012-06-07: 16:00:00 via TOPICAL

## 2012-06-07 MED ORDER — FENTANYL CITRATE 0.05 MG/ML IJ SOLN
INTRAMUSCULAR | Status: DC | PRN
  Administered 2012-06-07 (×4): 50 ug via INTRAVENOUS
  Administered 2012-06-07: 150 ug via INTRAVENOUS
  Administered 2012-06-07 (×2): 50 ug via INTRAVENOUS

## 2012-06-07 MED ORDER — ROCURONIUM BROMIDE 100 MG/10ML IV SOLN
INTRAVENOUS | Status: DC | PRN
  Administered 2012-06-07: 50 mg via INTRAVENOUS

## 2012-06-07 MED ORDER — LIDOCAINE HCL (CARDIAC) 20 MG/ML IV SOLN
INTRAVENOUS | Status: DC | PRN
  Administered 2012-06-07: 80 mg via INTRAVENOUS

## 2012-06-07 MED ORDER — CHLORHEXIDINE GLUCONATE 0.12 % MT SOLN
15.0000 mL | Freq: Two times a day (BID) | OROMUCOSAL | Status: DC
  Administered 2012-06-07 – 2012-06-08 (×2): 15 mL via OROMUCOSAL
  Filled 2012-06-07 (×2): qty 15

## 2012-06-07 MED ORDER — LACTATED RINGERS IV SOLN
INTRAVENOUS | Status: DC | PRN
  Administered 2012-06-07: 16:00:00 via INTRAVENOUS

## 2012-06-07 MED ORDER — PROPOFOL 10 MG/ML IV EMUL
5.0000 ug/kg/min | INTRAVENOUS | Status: DC
  Administered 2012-06-08 (×3): 50 ug/kg/min via INTRAVENOUS
  Filled 2012-06-07 (×2): qty 100

## 2012-06-07 MED ORDER — ONDANSETRON HCL 4 MG/2ML IJ SOLN
INTRAMUSCULAR | Status: DC | PRN
  Administered 2012-06-07: 4 mg via INTRAVENOUS

## 2012-06-07 MED ORDER — PHENYLEPHRINE HCL 10 MG/ML IJ SOLN
10.0000 mg | INTRAVENOUS | Status: DC | PRN
  Administered 2012-06-07: 100 ug/min via INTRAVENOUS

## 2012-06-07 MED ORDER — NEOSTIGMINE METHYLSULFATE 1 MG/ML IJ SOLN
INTRAMUSCULAR | Status: DC | PRN
  Administered 2012-06-07: 5 mg via INTRAVENOUS

## 2012-06-07 MED ORDER — BACITRACIN ZINC 500 UNIT/GM EX OINT
TOPICAL_OINTMENT | CUTANEOUS | Status: DC | PRN
  Administered 2012-06-07: 1 via TOPICAL

## 2012-06-07 SURGICAL SUPPLY — 73 items
BENZOIN TINCTURE PRP APPL 2/3 (GAUZE/BANDAGES/DRESSINGS) ×2 IMPLANT
BIT DRILL MOUNTAINER FXED 12MM (DRILL) ×1 IMPLANT
BLADE CLIPPER SURG NEURO (BLADE) ×2 IMPLANT
BUR NEURO DRILL SOFT 3.0X3.8M (BURR) ×4 IMPLANT
BUR PRESCISION 1.7 ELITE (BURR) ×4 IMPLANT
CLOTH BEACON ORANGE TIMEOUT ST (SAFETY) ×2 IMPLANT
CLSR STERI-STRIP ANTIMIC 1/2X4 (GAUZE/BANDAGES/DRESSINGS) ×2 IMPLANT
CONT SPEC STER OR (MISCELLANEOUS) ×2 IMPLANT
CORDS BIPOLAR (ELECTRODE) ×2 IMPLANT
COVER SURGICAL LIGHT HANDLE (MISCELLANEOUS) ×2 IMPLANT
DRAIN CHANNEL 10F 3/8 F FF (DRAIN) IMPLANT
DRAIN CHANNEL 15F RND FF W/TCR (WOUND CARE) IMPLANT
DRAIN HEMOVAC 1/8 X 5 (WOUND CARE) IMPLANT
DRAPE C-ARM 42X72 X-RAY (DRAPES) ×2 IMPLANT
DRAPE INCISE IOBAN 66X45 STRL (DRAPES) ×2 IMPLANT
DRAPE POUCH INSTRU U-SHP 10X18 (DRAPES) ×2 IMPLANT
DRAPE PROXIMA HALF (DRAPES) ×10 IMPLANT
DRAPE SURG 17X23 STRL (DRAPES) ×16 IMPLANT
DRAPE TABLE COVER HEAVY DUTY (DRAPES) ×2 IMPLANT
DRILL MOUNTAINEER FIXED 12MM (DRILL) ×2
DRSG MEPILEX BORDER 4X8 (GAUZE/BANDAGES/DRESSINGS) ×2 IMPLANT
DURAPREP 26ML APPLICATOR (WOUND CARE) ×2 IMPLANT
ELECT BLADE 4.0 EZ CLEAN MEGAD (MISCELLANEOUS) ×2
ELECT CAUTERY BLADE 6.4 (BLADE) ×2 IMPLANT
ELECT REM PT RETURN 9FT ADLT (ELECTROSURGICAL) ×2
ELECTRODE BLDE 4.0 EZ CLN MEGD (MISCELLANEOUS) ×1 IMPLANT
ELECTRODE REM PT RTRN 9FT ADLT (ELECTROSURGICAL) ×1 IMPLANT
EVACUATOR SILICONE 100CC (DRAIN) ×2 IMPLANT
GAUZE SPONGE 4X4 16PLY XRAY LF (GAUZE/BANDAGES/DRESSINGS) ×6 IMPLANT
GLOVE BIO SURGEON STRL SZ8 (GLOVE) ×2 IMPLANT
GLOVE BIOGEL PI IND STRL 8 (GLOVE) ×1 IMPLANT
GLOVE BIOGEL PI INDICATOR 8 (GLOVE) ×1
GOWN PREVENTION PLUS XLARGE (GOWN DISPOSABLE) ×2 IMPLANT
GOWN STRL NON-REIN LRG LVL3 (GOWN DISPOSABLE) ×8 IMPLANT
IV CATH 14GX2 1/4 (CATHETERS) ×2 IMPLANT
KIT BASIN OR (CUSTOM PROCEDURE TRAY) ×2 IMPLANT
KIT ROOM TURNOVER OR (KITS) ×2 IMPLANT
NEEDLE 27GAX1X1/2 (NEEDLE) ×2 IMPLANT
NEEDLE ASP BONE MRW (NEEDLE) ×2 IMPLANT
NS IRRIG 1000ML POUR BTL (IV SOLUTION) ×2 IMPLANT
PACK LAMINECTOMY ORTHO (CUSTOM PROCEDURE TRAY) ×2 IMPLANT
PACK VITOSS BIOACTIVE 10CC (Neuro Prosthesis/Implant) ×2 IMPLANT
PAD ARMBOARD 7.5X6 YLW CONV (MISCELLANEOUS) ×4 IMPLANT
PATTIES SURGICAL .5 X.5 (GAUZE/BANDAGES/DRESSINGS) ×2 IMPLANT
PATTIES SURGICAL .5 X3 (DISPOSABLE) ×2 IMPLANT
PATTIES SURGICAL .5X1.5 (GAUZE/BANDAGES/DRESSINGS) ×2 IMPLANT
PIN MAYFIELD SKULL DISP (PIN) ×2 IMPLANT
ROD MOUTAINEER 3.5X200MM (Rod) ×4 IMPLANT
SCREW F A 3.5X14 (Screw) ×12 IMPLANT
SCREW INNER (Screw) ×20 IMPLANT
SCREW MOUNTAINEER 3.5X16 (Screw) ×2 IMPLANT
SCREW MOUNTAINEER 3.5X16MM (Screw) ×2 IMPLANT
SCREW MTN POLY 4.0X22MM (Screw) ×4 IMPLANT
SPONGE GAUZE 4X4 12PLY (GAUZE/BANDAGES/DRESSINGS) ×2 IMPLANT
SPONGE INTESTINAL PEANUT (DISPOSABLE) ×2 IMPLANT
SPONGE SURGIFOAM ABS GEL 100 (HEMOSTASIS) ×2 IMPLANT
STRIP CLOSURE SKIN 1/2X4 (GAUZE/BANDAGES/DRESSINGS) ×2 IMPLANT
SURGIFLO TRUKIT (HEMOSTASIS) IMPLANT
SUT MNCRL AB 3-0 PS2 18 (SUTURE) ×2 IMPLANT
SUT VIC AB 0 CT1 18XCR BRD 8 (SUTURE) ×1 IMPLANT
SUT VIC AB 0 CT1 8-18 (SUTURE) ×1
SUT VIC AB 1 CT1 18XCR BRD 8 (SUTURE) ×3 IMPLANT
SUT VIC AB 1 CT1 8-18 (SUTURE) ×3
SUT VIC AB 2-0 CT2 18 VCP726D (SUTURE) ×2 IMPLANT
SYR BULB IRRIGATION 50ML (SYRINGE) ×2 IMPLANT
SYR CONTROL 10ML LL (SYRINGE) ×2 IMPLANT
TAPE CLOTH 4X10 WHT NS (GAUZE/BANDAGES/DRESSINGS) ×2 IMPLANT
TAPE CLOTH SURG 4X10 WHT LF (GAUZE/BANDAGES/DRESSINGS) ×2 IMPLANT
TOWEL OR 17X24 6PK STRL BLUE (TOWEL DISPOSABLE) ×2 IMPLANT
TOWEL OR 17X26 10 PK STRL BLUE (TOWEL DISPOSABLE) ×2 IMPLANT
TRAY FOLEY CATH 14FR (SET/KITS/TRAYS/PACK) ×2 IMPLANT
WATER STERILE IRR 1000ML POUR (IV SOLUTION) ×2 IMPLANT
YANKAUER SUCT BULB TIP NO VENT (SUCTIONS) ×2 IMPLANT

## 2012-06-07 NOTE — Preoperative (Signed)
Beta Blockers   Reason not to administer Beta Blockers:Not Applicable 

## 2012-06-07 NOTE — Anesthesia Postprocedure Evaluation (Signed)
  Anesthesia Post-op Note  Patient: Patrick Richards  Procedure(s) Performed: Procedure(s) (LRB): POSTERIOR CERVICAL FUSION/FORAMINOTOMY LEVEL 4 (N/A)  Patient Location: PACU  Anesthesia Type: General  Level of Consciousness: Patient remains intubated per anesthesia plan  Airway and Oxygen Therapy: Patient remains intubated per anesthesia plan  Post-op Pain: mild  Post-op Assessment: Post-op Vital signs reviewed  Post-op Vital Signs: Reviewed  Complications: No apparent anesthesia complications

## 2012-06-07 NOTE — Op Note (Signed)
NAMEDEVINN, Patrick Richards NO.:  [id number]  MEDICAL RECORD NO.:  [id number]  LOCATION:  5N24C                        FACILITY:  [facility]  PHYSICIAN:  Estill Bamberg, MD      DATE OF BIRTH:  1950-11-06  DATE OF PROCEDURE:  06/06/2012 DATE OF DISCHARGE:                              OPERATIVE REPORT   PREOPERATIVE DIAGNOSES: 1. Osteomyelitis, C4-C5. 2. Prevertebral abscess. 3. C4-5 diskitis.  POSTOPERATIVE DIAGNOSES: 1. Osteomyelitis, C4-C5. 2. Prevertebral abscess. 2. C4-5 diskitis.  PROCEDURES: 1. Corpectomy C4. 2. Corpectomy C5. 3. Placement of structural allograft, C3-C6. 4. Placement of anterior instrumentation, C3-C6. 5. Irrigation and debridement of cervical wound involving the     prevertebral space in addition to the C4 and C5 vertebral bodies.  SURGEON:  Estill Bamberg, MD.  ASSISTANT:  Skip Mayer, PA-C  ANESTHESIA:  General endotracheal anesthesia.  COMPLICATIONS:  None.  DISPOSITION:  Stable.  ESTIMATED BLOOD LOSS:  300 mL.  INDICATIONS FOR PROCEDURE:  Briefly, Patrick Richards is a very pleasant 61- year-old male who I first met earlier today.  The patient initially presented to my office, seen by partner of mine yesterday with complaints related to pain in his neck.  The pain has been ongoing for 1 month.  An MRI did reveal diskitis at the C4-5 level in addition to osteomyelitis with involvement of the bone.  There was also noted to be a prevertebral abscess.  I did have a lengthy discussion with the patient and his family regarding the need for surgical intervention.  Of note, I did discuss the patient's situation with Interventional Radiology and it was felt that the abscess and infection was not able to be accessed interventionally.  I therefore did have a discussion regarding going forward with a staged procedure involving an anterior C4 and C5 corpectomy with strut allograft from C3-C6 in addition to placement of anterior instrumentation.   This was to be done on day 1. Given the complexity of the case and the length of the procedure, I did discuss going forward with the stage procedure, specifically a posterior cervical fusion on the following day.  The patient fully understood the risks and limitations of the procedure.  Of particular note, the patient was noted to have a very tortuous left-sided vertebral artery which was clearly extending into the C4 vertebral body.  Incorporated this into the preoperative plan.  OPERATIVE DETAILS:  On June 06, 2012, the patient was brought to surgery and general endotracheal anesthesia was administered.  The patient was placed supine on a well-padded flat hospital bed.  The head was secured to the head holder.  Neck was prepped and draped in the usual sterile fashion.  I then made an oblique incision medial to the sternocleidomastoid muscle.  The platysma was sharply incised.  The plane between the carotid artery laterally and strap muscles medially was readily identified and explored.  Of particular note, there was noted to be obvious distortion of the natural tissue planes.  The anterior cervical spine was readily noted, however, there was a very obvious prominence directly over the anterior spine.  I did use mets meticulously to gain access into this prominence.  The  abscess did readily clear itself and was clearly under pressure.  The fluid was sampled for cultures.  Additional fluid was obtained for a cell count and Gram stain as well.  At this point, after cultures were obtained, 1 g of vancomycin was given.  I then continued to explore the wound and it was obvious that the infection was extensive, it did track up the prevertebral space up to approximate the C3 level as inferiorly as approximate at the C7 level.  The prevertebral fascia was clearly thickened and edematous.  Once the prevertebral fascia was violated throughout the anterior cervical spine, I did use pulsatile  irrigation to thoroughly perform a mechanical debridement using pulsatile irrigation.  I did use approximately 2 L of normal saline in the process of the debridement.  I then turned my attention towards the C5-6 interspace.  I did use a 15-blade knife to perform an annulotomy anteriorly.  I did use a series of curettes and Kerrison punches to perform a complete diskectomy from the right to the left uncovertebral joint.  I then turned my attention towards the C3-4 interspace.  Again, diskectomy was again performed using a series of curettes and Kerrison punches.  I did gain access to the posterior longitudinal ligament but the posterior longitudinal ligament was not violated any point.  I then turned my attention towards the corpectomy aspect of the procedure.  I did start at the C5 vertebral body.  I then proceeded with using a rongeur to remove the central aspect of the C5 vertebral body.  I then used a bur to create a trough in order to incorporate the strut graft. Bleeding was encountered and was controlled using FloSeal.  I then performed a C4 corpectomy, again using a rongeur in addition to high- speed bur.  I did use meticulous care and paid meticulous attention to this particular level, given the fact that there was a very tortuous vertebral artery on the left side entering into the vertebral body. This did give me a substantial amount of concern and I did ensure to take my time and use meticulous care in performing this aspect of the procedure.  I was able to create a trough centrally and to the right side to avoid the tortuous vertebral artery on the left.  Once the appropriate trough was created, I did continue to control bleeding from the vertebral bodies using FloSeal.  I then selected a structural iliac crest allograft from the musculoskeletal transplant foundation.  The graft was fashioned to approximately 35 mm in height.  The depth of the graft was fashioned to  approximately 13 mm.  At this point, I placed Caspar pins in the C6 and C3 vertebral bodies and distraction was applied across the C3-C6 vertebral bodies.  The graft was then tamped into position under slight distraction.  I was very pleased with the final press fit of the graft.  I then removed the Caspar pins and placed bone wax into place.  I then chose an appropriately sized Vectra plate. This was placed over the anterior cervical spine.  A 16 mm self- drilling, self-tapping screws were then placed, 2 in each vertebral body, 2 in C3 and 2 in C6 to span the 3 interspaces.  I was very pleased with the final press fit of the screws.  I was very pleased with the final appearance of the construct on both the AP and lateral radiographs.  Of note, the wound was copiously irrigated throughout the wound.  I  then placed a small JP drain over the anterior cervical spine. The platysma was closed using 2-0 Vicryl.  The skin was then closed using 4-0 Monocryl.  Benzoin and Steri-Strips were applied followed by sterile dressing and an Aspen cervical collar.  All instrument counts were correct at the termination of the procedure.  Of note, Skip Mayer was my assistant throughout the procedure and aided in essential retraction and suctioning required throughout the surgery.     Estill Bamberg, MD     MD/MEDQ  D:  06/06/2012  T:  06/07/2012  Job:  [id number]

## 2012-06-07 NOTE — Progress Notes (Signed)
Pt complained of restriction in his throat and nose, feeling like he could not fully inhale. Vitals at 0358 were 98.7 orally, BP 147/83, HR 84, 100% on 2L via nasal cannula, RR 18. Patient's throat was viewed and no swelling is around his epiglottis. The throat appears clear. Slight restriction can be heard when the stethescope is placed over the throat. Pt was instructed to inhale warm water into his nose to clear the nasal passageway.Pt stated this helped. He refused a throat lozenge at this time.Pt has been NPO since arriving onto the floor from PACU in preparation for another surgery tomorrow. He states his throat is sore, which is expected from his surgery. Will monitor patient to make sure condition does not worsen.

## 2012-06-07 NOTE — Anesthesia Preprocedure Evaluation (Addendum)
Anesthesia Evaluation  Patient identified by MRN, date of birth, ID band Patient awake    Reviewed: Allergy & Precautions, H&P , NPO status , Patient's Chart, lab work & pertinent test results  Airway Mallampati: II TM Distance: >3 FB Neck ROM: Full    Dental   Pulmonary    Pulmonary exam normal       Cardiovascular Rhythm:Regular Rate:Tachycardia     Neuro/Psych Anxiety    GI/Hepatic   Endo/Other    Renal/GU      Musculoskeletal   Abdominal   Peds  Hematology   Anesthesia Other Findings In collar C/o "tight" throat  Reproductive/Obstetrics                          Anesthesia Physical Anesthesia Plan  ASA: II  Anesthesia Plan: General   Post-op Pain Management:    Induction: Intravenous  Airway Management Planned: Oral ETT  Additional Equipment:   Intra-op Plan:   Post-operative Plan: Extubation in OR  Informed Consent: I have reviewed the patients History and Physical, chart, labs and discussed the procedure including the risks, benefits and alternatives for the proposed anesthesia with the patient or authorized representative who has indicated his/her understanding and acceptance.     Plan Discussed with: CRNA and Surgeon  Anesthesia Plan Comments:        Anesthesia Quick Evaluation

## 2012-06-07 NOTE — Consult Note (Unsigned)
Patrick Richards, PITSTICK NO.:  [id number]  MEDICAL RECORD NO.:  [id number]  LOCATION:  5N24C                        FACILITY:  [facility]  PHYSICIAN:  Estill Bamberg, MD      DATE OF BIRTH:  [birth year]  DATE OF CONSULTATION:  06/06/2012 DATE OF DISCHARGE:                                CONSULTATION   REASON FOR CONSULTATION:  Neck pain.  HISTORY OF PRESENT ILLNESS:  Mr. Rosete is a very pleasant 61 year old male, who I was contacted regarding the possibility of patient having diskitis with a retropharyngeal abscess.  Of particular note, the patient was seen in my office by Dr. Renae Fickle yesterday.  On that date, Dr. Renae Fickle did review an MRI of the patient's neck.  Per Dr. Renae Fickle and per the patient, the patient has been complaining of ongoing intermittent pain in the region of his neck over the course of the last month.  He states that at times the pain is severe, but there are the times where his pain is less severe.  An MRI was obtained and it was very much consistent with diskitis at the C4-5 level.  Dr. Renae Fickle did make a decision to contact a hospitalist at [facility] and an Infectious Disease physician in addition to myself.  I ultimately was contacted this morning to evaluate the patient today.  I did evaluate the patient.  The patient did in fact complain of intermittent episodes of severe pain in his neck.  Of particular note, the patient denies any fevers subjectively.  The patient denies any recent infection.  The patient states he was entirely healthy with no medical history other than some anxiety for which he takes Cymbalta.  The patient denies any pain in his arms or in his legs.  He denies any issues with fine motor skills or with his balance.  He has no airway complications or any problems with his swallowing.  Again a common MRI was notable for diskitis with an abscess and I was called to evaluate.  The patient states when he does get the pain, he  describes the pain as a sharp pain at the posterior aspect of his neck.  PAST MEDICAL HISTORY:  Anxiety.  PAST SURGICAL HISTORY:  None.  MEDICATIONS:  Cymbalta and hydrocodone.  ALLERGIES:  None.  FAMILY HISTORY:  None of any significance.  SOCIAL HISTORY:  The patient does not smoke.  He does drink about 2.4 ounces of alcohol per week.  Denies use of illicit drugs.  He lives with his wife.  He does work regularly.  PHYSICAL EXAMINATION:  The patient is afebrile.  He is alert and oriented x3, in no acute distress.  He is neurovascularly intact throughout his upper and lower extremities.  The neck both anteriorly and posteriorly is unremarkable with no lesions or obvious swelling identified.  He does walk with a normal nonantalgic gait.  His respirations are normal.  There are no signs of respiratory distress. His extraocular motions are intact.  Again, his breathing is nonlabored. There no obvious thyroid enlargement.  I did in fact review the patient's MRI of his cervical spine dated June 05, 2012.  This clearly  is notable for significant edema about the C4-5 interspace.  There is also noted to be substantial edema extending into the C4 and C5 vertebral bodies.  The endplates of C4 and C5 are clearly compromised and there is kyphosis across the C4-5 level.  Also of note is an obvious prevertebral abscess, which measures approximately 3 cm x 2 cm x 3 cm.  Well, there is also noted to be moderate-to-severe degeneration of the disks above and below C4-5, C4-5 clearly is notable for the findings reflected above.  ASSESSMENT:  C4-5 diskitis extending into the C4 and C5 vertebral bodies, consistent with osteomyelitis with an obvious prevertebral abscess.  TREATMENT, RECOMMENDATIONS AND PLAN:  I did have a lengthy discussion with the patient and his wife.  In addition, I did call the intervention radiologist working today, Dr. Corliss Skains.  Dr. Corliss Skains does not feel there is any  safe way to gain access to the area of diskitis over the prevertebral abscess.  The implication here is that the patient would not be able to be started on the appropriate antibiotics to help control the infection.  I do feel that there are substantial concerns regarding treating this empirically.  If in fact the antibiotics administered to the patient empirically were not appropriate, the abscess can increase in size, and the infection can spread.  This would bring with the possibility of progressing on to a retropharyngeal abscess, which could compromise the patient's airway and swallowing function.  An epidural abscess can also occur, which can bring with a neurologic insult.  Therefore, in my direction, a CAT scan of the patient's cervical spine was obtained and again was clearly notable for obstruction of the C4-C5 endplates.  Also of note, the patient is noted to have an increased white blood cell count at 11.8 and an increased CRP at 3.0 and an increased sedimentation rate of 35.  After discussing this thoroughly with the patient, I do feel that operative intervention is appropriate.  I did specifically discuss going forward with an anterior approach to the cervical spine to irrigate and debride the wound.  Given the involvement of the C4-C5 vertebral bodies, I did also discuss going forward with a C4 and C5 corpectomy in addition to placing a strut graft across the C4-C5 levels in addition to an anterior cervical plate.  Given the compromise stability of the construct reflected above, I do also feel that instrumented posterior fusion is also needed and this was thoroughly discussed with the patient in detail.  This will be done in a staged fashion and the anterior portion will be done today and the posterior portion will be done the following day.  Intraoperative cultures will be obtained to ensure the appropriate antibiotics postoperatively.  I did thoroughly discuss the risk  of recurrent infection with the patient in addition to hardware complications, failure of the graft or hardware, adjacent segment disease, nonunion, as well as bleeding complications, swelling complications, and changes in the patient's voice.  We also discussed the risk of neurologic injury including spinal cord injury or nerve injury.  After fully discussing my thoughts as reflected above with the patient and his wife, they do wish to proceed.  We will go ahead and get the patient to set up with surgery later today.  Of note, the patient had breakfast at approximately 7:30 and therefore the plan currently is to go forward with the anterior portion of the procedure at 3:30 pm today.     Estill Bamberg, MD  MD/MEDQ  D:  06/06/2012  T:  06/07/2012  Job:  [id number]

## 2012-06-07 NOTE — Consult Note (Signed)
Name: Patrick Richards MRN: [id number] DOB: 1951/03/06    LOS: 2  Referring Provider:  Jacklynn Ganong) Reason for Referral:  Postop respiratory failure  PULMONARY / CRITICAL CARE MEDICINE  The patient is encephalopathic and unable to provide history, which was obtained for available medical records.  HPI:  61 yo with osteomyelitis C4-C5, discitis and prevertebral abscess s/p spinal surgery brought to ICU intubated.  Past Medical History  Diagnosis Date  . Anxiety     "situational"   Past Surgical History  Procedure Date  . No past surgeries    Prior to Admission medications   Medication Sig Start Date End Date Taking? Authorizing Provider  DULoxetine (CYMBALTA) 30 MG capsule Take 30 mg by mouth daily.   Yes Historical Provider, MD  HYDROcodone-acetaminophen (NORCO/VICODIN) 5-325 MG per tablet Take 1 tablet by mouth every 6 (six) hours as needed. For pain   Yes Historical Provider, MD   Allergies No Known Allergies  Family History History reviewed. No pertinent family history. Social History  reports that he has never smoked. He has never used smokeless tobacco. He reports that he drinks about 2.4 ounces of alcohol per week. He reports that he does not use illicit drugs.  Review Of Systems:  Unable to provide  Brief patient description:  61 yo with osteomyelitis C4-C5, discitis and prevertebral abscess s/p spinal surgery brought to ICU intubated.  Events Since Admission: 8/8  S/p spinal surgery brought to ICU intubated.  Current Status:  Vital Signs: Temp:  [98.1 F (36.7 C)-99.5 F (37.5 C)] 98.4 F (36.9 C) (08/08 2145) Pulse Rate:  [84-110] 97  (08/08 2230) Resp:  [12-18] 14  (08/08 2230) BP: (137-163)/(83-96) 151/91 mmHg (08/08 2230) SpO2:  [100 %] 100 % (08/08 2230) FiO2 (%):  [50 %-60 %] 50 % (08/08 2210) Weight:  [79.3 kg (174 lb 13.2 oz)] 79.3 kg (174 lb 13.2 oz) (08/08 2205)  Physical Examination: General:  Mechanically ventilated, sunchronous Neuro:   Sedated HEENT:  PERRL Neck:  Neck collar / drain in place Cardiovascular:  RRR Lungs:  CTAB Abdomen:  Soft, non tender, bowel sounds present Musculoskeletal:  No edema Skin:  Intact  Principal Problem:  *Diskitis Active Problems:  Neck pain  Anxiety  Postoperative respiratory failure  Encephalopathy acute  ASSESSMENT AND PLAN  PULMONARY No results found for this basename: PHART:5,PCO2:5,PCO2ART:5,PO2ART:5,HCO3:5,O2SAT:5 in the last 168 hours  Ventilator Settings: Vent Mode:  [-] SIMV;PRVC FiO2 (%):  [50 %-60 %] 50 % Set Rate:  [14 bmp] 14 bmp Vt Set:  [600 mL] 600 mL PEEP:  [5 cmH20] 5 cmH20 Plateau Pressure:  [13 cmH20] 13 cmH20  CXR:  8/8 >>> ETT:  8/8 >>>  A:  Acute postop respiratory failure. P:   Full support overnight ABG CXR SBT in AM  CARDIOVASCULAR No results found for this basename: TROPONINI:5,LATICACIDVEN:5, O2SATVEN:5,PROBNP:5 in the last 168 hours  ECG:  NA Lines: NA  A: Hemodynamically stable.  No arrhythmia / ischemia. P:  Telemetry  RENAL  Lab 06/06/12 0550  NA 140  K 4.0  CL 98  CO2 31  BUN 17  CREATININE 0.85  CALCIUM 9.2  MG --  PHOS --   Intake/Output      08/08 0701 - 08/09 0700   P.O.    I.V. (mL/kg) 2750 (34.7)   IV Piggyback 500   Total Intake(mL/kg) 3250 (41)   Urine (mL/kg/hr) 1480 (1.2)   Drains 10   Blood 450   Total Output 1940   Net +1310  Foley:  8/8 >>>  A:  Normal renal function / electrolytes. P:   Trend BMP  GASTROINTESTINAL No results found for this basename: AST:5,ALT:5,ALKPHOS:5,BILITOT:5,PROT:5,ALBUMIN:5 in the last 168 hours  A: No active issues. P:   NPO as intubated  HEMATOLOGIC  Lab 06/06/12 0550 06/05/12 1956  HGB 12.8* 13.4  HCT 36.9* 37.9*  PLT 351 378  INR -- --  APTT -- --   A:  Mild anemia. P:  Trend CBC  INFECTIOUS  Lab 06/06/12 0550 06/05/12 1956  WBC 11.8* 11.3*  PROCALCITON -- --   Cultures: 8/6  Blood >>> 8/7  Wound >>>  Antibiotics: Ceftriaxone  8/8 >>> Vancomycin 8/8 >>>  A:  Discitis.  Osteomyelitis.  Prevertebral abscess. P:   Per Ortho  ENDOCRINE No results found for this basename: GLUCAP:5 in the last 168 hours A:  No active issues. P:   No interventions required  NEUROLOGIC  A:  Acute encephalopathy secondary to sedation. P:   Propofol for for synchrony Pain control per Ortho [facility] in AM  BEST PRACTICE / DISPOSITION Level of Care:  ICU Primary Service:  Ortho Consultants:  PCCM Code Status:  Full Diet:  NPO DVT Px:  SCDs GI Px:  Protonix Skin Integrity:  Intact Social / Family:  Not available  The patient is critically ill with multiple organ systems failure and requires high complexity decision making for assessment and support, frequent evaluation and titration of therapies, application of advanced monitoring technologies and extensive interpretation of multiple databases. Critical Care Time devoted to patient care services described in this note is 30 minutes.  Lonia Farber, M.D. Pulmonary and Critical Care Medicine [facility] Pager: [phone]  06/07/2012, 10:35 PM

## 2012-06-07 NOTE — Progress Notes (Signed)
[facility]    Date of Admission:  06/05/2012    Principal Problem:  *Diskitis Active Problems:  Neck pain  Anxiety      . DULoxetine  30 mg Oral Daily  . HYDROmorphone      . vancomycin  750 mg Intravenous Q8H  . DISCONTD: cefTRIAXone (ROCEPHIN)  IV  2 g Intravenous Q24H  . DISCONTD: vancomycin  750 mg Intravenous Q8H    Subjective: Patrick Richards feels better today. He complaints of mild neck pain and has a hard collar around his neck. He had surgery yesterday and post operative period was uneventful. He denies any fever, chills, abdominal pain, n/v/d.  Objective: Temp:  [97.6 F (36.4 C)-100.2 F (37.9 C)] 98.7 F (37.1 C) (08/08 0358) Pulse Rate:  [84-102] 84  (08/08 0358) Resp:  [16-18] 18  (08/08 0358) BP: (147-162)/(83-94) 147/83 mmHg (08/08 0358) SpO2:  [93 %-100 %] 100 % (08/08 0358) Weight:  [74.798 kg (164 lb 14.4 oz)] 74.798 kg (164 lb 14.4 oz) (08/07 1700)  General: A&OX3, lying on bed with hard collar around his neck Skin: Warm Lungs: CTAB Cor: RRR, no murmurs, robs, gallops Abdomen: Soft, non distended, non tender, bs+ Extremities: pulses 2+ b/l  Lab Results Lab Results  Component Value Date   WBC 11.8* 06/06/2012   HGB 12.8* 06/06/2012   HCT 36.9* 06/06/2012   MCV 88.9 06/06/2012   PLT 351 06/06/2012    Lab Results  Component Value Date   CREATININE 0.85 06/06/2012   BUN 17 06/06/2012   NA 140 06/06/2012   K 4.0 06/06/2012   CL 98 06/06/2012   CO2 31 06/06/2012    No results found for this basename: ALT, AST, GGT, ALKPHOS, BILITOT      Microbiology: Recent Results (from the past 240 hour(s))  CULTURE, BLOOD (ROUTINE X 2)     Status: Normal (Preliminary result)   Collection Time   06/05/12  7:25 PM      Component Value Range Status Comment   Specimen Description BLOOD RIGHT ARM   Final    Special Requests BOTTLES DRAWN AEROBIC AND ANAEROBIC 10CC   Final    Culture  Setup Time 06/06/2012 04:00   Final    Culture     Final    Value:         BLOOD CULTURE RECEIVED NO GROWTH TO DATE CULTURE WILL BE HELD FOR 5 DAYS BEFORE ISSUING A FINAL NEGATIVE REPORT   Report Status PENDING   Incomplete   CULTURE, BLOOD (ROUTINE X 2)     Status: Normal (Preliminary result)   Collection Time   06/05/12  7:34 PM      Component Value Range Status Comment   Specimen Description BLOOD RIGHT ARM   Final    Special Requests BOTTLES DRAWN AEROBIC AND ANAEROBIC 10CC   Final    Culture  Setup Time 06/06/2012 04:00   Final    Culture     Final    Value:        BLOOD CULTURE RECEIVED NO GROWTH TO DATE CULTURE WILL BE HELD FOR 5 DAYS BEFORE ISSUING A FINAL NEGATIVE REPORT   Report Status PENDING   Incomplete   GRAM STAIN     Status: Normal   Collection Time   06/06/12  5:12 PM      Component Value Range Status Comment   Specimen Description WOUND ASPIRATE NECK   Final    Special Requests NONE  Final    Gram Stain     Final    Value: ABUNDANT WBC PRESENT,BOTH PMN AND MONONUCLEAR     ABUNDANT GRAM POSITIVE COCCI IN PAIRS IN CHAINS   Report Status 06/06/2012 FINAL   Final   WOUND CULTURE     Status: Normal (Preliminary result)   Collection Time   06/06/12  5:12 PM      Component Value Range Status Comment   Specimen Description WOUND ASPIRATE NECK   Final    Special Requests NONE   Final    Gram Stain     Final    Value: ABUNDANT WBC PRESENT,BOTH PMN AND MONONUCLEAR     ABUNDANT GRAM POSITIVE COCCI IN PAIRS AND CHAINS     Performed at [facility]   Culture NO GROWTH   Final    Report Status PENDING   Incomplete   ANAEROBIC CULTURE     Status: Normal (Preliminary result)   Collection Time   06/06/12  5:12 PM      Component Value Range Status Comment   Specimen Description WOUND ASPIRATE NECK   Final    Special Requests NONE   Final    Gram Stain     Final    Value: ABUNDANT WBC PRESENT,BOTH PMN AND MONONUCLEAR     ABUNDANT GRAM POSITIVE COCCI IN PAIRS AND CHAINS     Performed at [facility]   Culture PENDING   Incomplete     Report Status PENDING   Incomplete     Studies/Results: Dg Cervical Spine 1 View  06/07/2012  *RADIOLOGY REPORT*  Clinical Data: Post surgical neck pain, difficulty swallowing  DG CERVICAL SPINE - 1 VIEW  Comparison: C-arm spot films of 06/06/2012  Findings: An anterior metallic fixation plate is present extending from C3-C6 with corpectomy at C4-5.  There is significant prevertebral soft tissue swelling most likely representing postoperative hematoma.  IMPRESSION:  1.  Probable postoperative prevertebral hematoma. 2.  Anterior fusion from C3-C6 with normal alignment.  Original Report Authenticated By: Juline Patch, M.D.   Dg Cervical Spine 2-3 Views  06/06/2012  *RADIOLOGY REPORT*  Clinical Data: C3-C6 fusion, C4-5 corpectomy  DG C-ARM GT 120 MIN,CERVICAL SPINE - 2-3 VIEW  Technique: Spot fluoroscopic intraoperative views  Comparison:  06/05/2012  Findings: Anterior plate and screw fixation spans from C3-C6 following C4-5 corpectomy.  On the lateral view, there is a radiopaque surgicalsponge in the neck soft tissues anteriorly. Alignment appears anatomic.  Hardware intact.  IMPRESSION: C4-5 corpectomy with plate screw fixation from C3-C6.  Expected postoperative appearance and normal alignment  Original Report Authenticated By: Judie Petit. Ruel Favors, M.D.   Ct Cervical Spine Wo Contrast  06/06/2012  *RADIOLOGY REPORT*  Clinical Data: Progressive neck pain for 1 month.  The patient denies injury, systemic symptoms and weakness.  Follow-up MRI.  CT CERVICAL SPINE WITHOUT CONTRAST  Technique:  Multidetector CT imaging of the cervical spine was performed. Multiplanar CT image reconstructions were also generated.  Comparison: Cervical MRI 06/05/2012.  Findings: The overall cervical alignment is stable and near anatomic.  Endplate destruction at C4-C5 is grossly unchanged from recent MRI.  There is no osseous retropulsion.  There are some cystic changes within the vertebral bodies adjacent to the left aspect of the  C5-C6 disc space with mild endplate destruction. There is no evidence of acute fracture.  Soft tissue evaluation is limited on noncontrast CT compared with MRI.  The prevertebral soft tissue swelling and ill-defined fluid collections are  grossly unchanged. There is some prominence of the anterior epidural soft tissues on the sagittal images without obvious fluid collection.  Resulting mild compromise of the spinal canal appears grossly unchanged.  IMPRESSION:  1.  No gross change in the findings at C4-C5 concerning for diskitis, osteomyelitis and prevertebral abscess formation as correlated with MRI performed yesterday. 2.  Of note, paravertebral and epidural soft tissue evaluation is limited by noncontrast CT.  Original Report Authenticated By: Gerrianne Scale, M.D.   Mr Cervical Spine Wo Contrast  06/05/2012  *RADIOLOGY REPORT*  Clinical Data: Neck pain for 1 month radiating into the left shoulder.  No known injury.  MRI CERVICAL SPINE WITHOUT CONTRAST  Technique:  Multiplanar and multiecho pulse sequences of the cervical spine, to include the craniocervical junction and cervicothoracic junction, were obtained according to standard protocol without intravenous contrast.  Comparison: None.  Findings: There is fluid within the C3-4 disc interspace and endplate destruction consistent with diskitis/osteomyelitis.  No epidural abscess is identified.  The patient has a prevertebral fluid collection consistent with an abscess measuring 2.8 cm transverse by 2.1 cm AP by 2.6 cm cranial-caudal.  There is straightening of the normal cervical lordosis.  The craniocervical junction is normal and cervical cord signal is normal.  C2-3:  Mild disc bulge.  Facet degenerative disease is worse on the left.  Central canal and foramina remain open.  C3-4:  Disc osteophyte complex and uncovertebral disease are identified.  Ventral thecal sac is effaced.  Moderate to moderately severe bilateral foraminal narrowing is identified.   C4-5:  Endplate spur effaces the ventral thecal sac.  Uncovertebral disease causes marked bilateral foraminal narrowing.  C5-6:  Disc bulge effaces the ventral thecal sac.  Uncovertebral disease causes mild to moderate foraminal narrowing.  C6-7:  Small disc osteophyte complex and uncovertebral disease, greater on the left, are seen.  Central canal and right foramen are open.  Mild left foraminal narrowing noted.  C7-T1:  Mild disc bulge without central canal narrowing.  Foramina appear mildly narrowed.  IMPRESSION:  1.  Findings consistent with diskitis/osteomyelitis at C4-5.  No epidural abscess is identified although there is a prevertebral abscess as described above. 2.  Degenerative disease as described above.  Critical Value/emergent results were called by telephone at the time of interpretation on 06/05/2012 at 2:10 p.m. to Dr. Renae Fickle, who verbally acknowledged these results.  Original Report Authenticated By: Bernadene Bell. D'ALESSIO, M.D.   Dg C-arm [place] Reeves 120 Min  06/06/2012  *RADIOLOGY REPORT*  Clinical Data: C3-C6 fusion, C4-5 corpectomy  DG C-ARM GT 120 MIN,CERVICAL SPINE - 2-3 VIEW  Technique: Spot fluoroscopic intraoperative views  Comparison:  06/05/2012  Findings: Anterior plate and screw fixation spans from C3-C6 following C4-5 corpectomy.  On the lateral view, there is a radiopaque surgicalsponge in the neck soft tissues anteriorly. Alignment appears anatomic.  Hardware intact.  IMPRESSION: C4-5 corpectomy with plate screw fixation from C3-C6.  Expected postoperative appearance and normal alignment  Original Report Authenticated By: Judie Petit. Ruel Favors, M.D.    Assessment: Mr. Piscopo 61 yo white male with chronic neck pain who presented with C4-C5 diskitis with changes on the MRI suggestive of osteomyelitis and prevertebral abscess s/p  1. Corpectomy C4.  2. Corpectomy C5.  3. Placement of structural allograft, C3-C6.  4. Placement of anterior instrumentation, C3-C6.  5. Irrigation and debridement of  cervical wound involving the  prevertebral space in addition to the C4 and C5 vertebral bodies With post operative period being uneventful and is  planned for another surgery later this afternoon for posterior cervical vertebral fusion. The wound culture was positive for grampositive cocci in pairs and clusters,with cultures still pending. He is on Vancomycin 750mg  IV TID.   Plan: 1) Continue Vancomycin 750mg  IV TID 2) F/u with blood and wound cultures  Donnajean Lopes,  Medical Student 06/07/2012, 11:17 AM  INFECTIOUS DISEASE ATTENDING ADDENDUM:     [facility]   Date: 06/07/2012  Patient name: Patrick Richards  Medical record number: [id number]  Date of birth: Sep 26, 1951    This patient has been seen and discussed with the house staff. Please see their note for complete details. I concur with their findings with the following additions/corrections:  Pt with GP cocci in pairs and chains. He should have rocephin added to his regimen. He is going to need 6 to 8 week sof parenterial therapy.  Acey Lav 06/07/2012, 6:47 PM

## 2012-06-07 NOTE — Anesthesia Postprocedure Evaluation (Signed)
  Anesthesia Post-op Note  Patient: Patrick Richards  Procedure(s) Performed: Procedure(s) (LRB): POSTERIOR CERVICAL FUSION/FORAMINOTOMY LEVEL 4 (N/A)  Patient Location: PACU and SICU  Anesthesia Type: General  Level of Consciousness: Patient remains intubated per anesthesia plan  Airway and Oxygen Therapy: Patient remains intubated per anesthesia plan  Post-op Pain: mild  Post-op Assessment: Post-op Vital signs reviewed  Post-op Vital Signs: Reviewed  Complications: No apparent anesthesia complications

## 2012-06-07 NOTE — Transfer of Care (Signed)
Immediate Anesthesia Transfer of Care Note  Patient: Patrick Richards  Procedure(s) Performed: Procedure(s) (LRB): POSTERIOR CERVICAL FUSION/FORAMINOTOMY LEVEL 4 (N/A)  Patient Location: PACU  Anesthesia Type: General  Level of Consciousness: sedated and patient cooperative  Airway & Oxygen Therapy: Patient remains intubated per anesthesia plan and Patient placed on Ventilator (see vital sign flow sheet for setting)  Post-op Assessment: Report given to PACU RN, Post -op Vital signs reviewed and stable and Patient moving all extremities X 4  Post vital signs: Reviewed and stable  Complications: No apparent anesthesia complications

## 2012-06-07 NOTE — Anesthesia Procedure Notes (Signed)
Performed by: Damarys Speir P       

## 2012-06-07 NOTE — Progress Notes (Signed)
Patient arrived from PACU on ventilator SIMV (PRVC) 600/14/5.0/50%. Patient is tolerating vent well at this time. RT will continue to monitor.

## 2012-06-07 NOTE — Progress Notes (Signed)
[facility] PROGRESS NOTE  Patrick Richards ZOX:[id number] DOB: 09/18/1951 DOA: 06/05/2012 PCP: Sid Falcon, MD  Assessment/Plan: Principal Problem:  *Diskitis Active Problems:  Neck pain  Anxiety  1. Discitis at C4-5 with prevertebral suspected abscess. Discussed with spinal surgery and given location and need for cultures, too risky with interventional radiology. Patient went to the OR yesterday for fluid removal . Once cultures were sent, patient was started on antibiotics. Today he is going back to the OR for cervical fusion.  Infectious disease is following as well. Certainly concerning and causes unusual and unknown at this time. Patient is critically at risk for severe systemic septicemia, secondary endocarditis or neurologic dysfunction at the cervical level. Post surgery, patient is being moved to the step down unit 2. Neck pain: Secondary to #1. Pain control. 3. When necessary benzodiazepines for anxiety.  Code Status: Full code Family Communication: Spoke with patient's wife was at bedside today Disposition Plan: Depending on outcome, possibly and hopefully can go home   Brief narrative: 61 year old white male with no past medical history who presents with 2 days of neck pain and found to have a discitis at the cervical level with secondary fluid collection suspected to be an abscess. After extensive discussion with infectious disease and spinal surgery, holding off on broad-spectrum antibiotics until after fluid can be extracted and sent for cultures. To be the immediate moment, the patient is stable with no neurologic dysfunction yet because of this abscess  Consultants:  Snyder-infectious disease  Dumonski-orthopedic spinal surgery  Procedures:  Scheduled for surgery today for fluid extraction  Antibiotics:  None yet until after surgery  HPI/Subjective: Patient not seen as he was in the OR during rounds  Objective: Filed Vitals:   06/06/12 2234 06/07/12  0008 06/07/12 0358 06/07/12 1300  BP: 160/94  147/83 150/85  Pulse: 90  84 110  Temp: 100.2 F (37.9 C) 98.7 F (37.1 C) 98.7 F (37.1 C) 99.5 F (37.5 C)  TempSrc: Axillary  Oral Oral  Resp: 16  18   Height:      Weight:      SpO2: 93%  100%     Intake/Output Summary (Last 24 hours) at 06/07/12 1720 Last data filed at 06/07/12 1645  Gross per 24 hour  Intake   3850 ml  Output   2085 ml  Net   1765 ml    Exam:   Deferred because patient was in the operating room  Data Reviewed: Basic Metabolic Panel:  Lab 06/06/12 8119  NA 140  K 4.0  CL 98  CO2 31  GLUCOSE 102*  BUN 17  CREATININE 0.85  CALCIUM 9.2  MG --  PHOS --   CBC:  Lab 06/06/12 0550 06/05/12 1956  WBC 11.8* 11.3*  NEUTROABS -- --  HGB 12.8* 13.4  HCT 36.9* 37.9*  MCV 88.9 87.7  PLT 351 378   Microbiology: Preliminary cultures note abundant gram-positive cocci in pairs and chains  Studies: Ct Cervical Spine Wo Contrast  06/06/2012  IMPRESSION:  1.  No gross change in the findings at C4-C5 concerning for diskitis, osteomyelitis and prevertebral abscess formation as correlated with MRI performed yesterday. 2.  Of note, paravertebral and epidural soft tissue evaluation is limited by noncontrast CT.  Mr Cervical Spine Wo Contrast 06/05/2012    IMPRESSION:  1.  Findings consistent with diskitis/osteomyelitis at C4-5.  No epidural abscess is identified although there is a prevertebral abscess as described above. 2.  Degenerative disease as described above.  Scheduled Meds:    . DULoxetine  30 mg Oral Daily  . HYDROmorphone      . vancomycin  750 mg Intravenous Q8H  . DISCONTD: cefTRIAXone (ROCEPHIN)  IV  2 g Intravenous Q24H  . DISCONTD: vancomycin  750 mg Intravenous Q8H   Continuous Infusions:    . lactated ringers 50 mL/hr at 06/06/12 1534    Time spent: 40 minutes  Patrick Richards  [facility] Pager [phone]. If 8PM-8AM, please contact night-coverage at [email or web address],  password [facility] 06/07/2012, 5:20 PM  LOS: 2 days

## 2012-06-07 NOTE — Progress Notes (Signed)
Patient comfortable. Feels that throat is tight.  BP 147/83  Pulse 84  Temp 98.7 F (37.1 C) (Oral)  Resp 18  Ht 6\' 1"  (1.854 m)  Wt 74.798 kg (164 lb 14.4 oz)  BMI 21.76 kg/m2  SpO2 100%  NVI Collar in place  S/p C4/5 corp, pending PCF today  - STAT lateral radiograph still pending - NPO - maintain drain - abx per ID - will follow cultures

## 2012-06-08 ENCOUNTER — Encounter (HOSPITAL_COMMUNITY): Payer: Self-pay | Admitting: Orthopedic Surgery

## 2012-06-08 LAB — BLOOD GAS, ARTERIAL
Acid-Base Excess: 3.2 mmol/L — ABNORMAL HIGH (ref 0.0–2.0)
MECHVT: 630 mL
Patient temperature: 98.9
RATE: 16 resp/min
TCO2: 27.3 mmol/L (ref 0–100)
pH, Arterial: 7.502 — ABNORMAL HIGH (ref 7.350–7.450)

## 2012-06-08 NOTE — Progress Notes (Signed)
UR complete 

## 2012-06-08 NOTE — Progress Notes (Signed)
Patient evaluated.  Still tubed.  Alert and able to follow commands.  BP 144/113  Pulse 105  Temp 99 F (37.2 C) (Axillary)  Resp 21  Ht 6\' 1"  (1.854 m)  Wt 79.3 kg (174 lb 13.2 oz)  BMI 23.07 kg/m2  SpO2 100%  NVI with full strength b/l Dressing CDI anteriorly adn posteriorly  Drain output minimal  S/p A/P cervical fusion  - drain removed - extubation per critical care team - OK to transfer to general floor from my standpoint, but would defer decision to primary service - abx per ID team

## 2012-06-08 NOTE — Procedures (Signed)
Extubation Procedure Note  Patient Details:   Name: Patrick Richards DOB: 18-Dec-1950 MRN: [id number]   Airway Documentation:  Airway 7.5 mm (Active)   Patient self extubated. Parameters had been done as well as cuff leak test. Patient placed on 4lpm [place]. Evaluation  O2 sats: stable throughout Complications: No apparent complications Patient did tolerate procedure well. Bilateral Breath Sounds: Clear Suctioning: Airway Yes  Clearance Coots 06/08/2012, 12:04 PM

## 2012-06-08 NOTE — Progress Notes (Signed)
[facility]    Date of Admission:  06/05/2012   Total days of antibiotics 3        Day Ceftriaxone : 2        Day  Vancomycin: 3  Principal Problem:  *Diskitis Active Problems:  Neck pain  Anxiety  Postoperative respiratory failure  Encephalopathy acute      . cefTRIAXone (ROCEPHIN)  IV  2 g Intravenous Q24H  . DULoxetine  30 mg Oral Daily  . methocarbamol (ROBAXIN) IV  500 mg Intravenous Q6H  . oxyCODONE  10 mg Oral Q12H  . pantoprazole (PROTONIX) IV  40 mg Intravenous QHS  . vancomycin  750 mg Intravenous Q8H  . DISCONTD: antiseptic oral rinse  15 mL Mouth Rinse QID  . DISCONTD: chlorhexidine  15 mL Mouth Rinse BID    Subjective: Patrick Richards 61 yo male with osteo myelities and C4-C5 diskitis and prevertebral abscess s/p spinal surgery with post operative respiratory failure who was intubated yesterday and extubated this morning.  He feels better today. He denies any fever, chills, n/v/d, abdominal pain.   Objective: Temp:  [98.1 F (36.7 C)-99.5 F (37.5 C)] 99 F (37.2 C) (08/09 0700) Pulse Rate:  [94-110] 105  (08/09 0900) Resp:  [12-21] 12  (08/09 0900) BP: (133-163)/(74-113) 160/85 mmHg (08/09 0900) SpO2:  [100 %] 100 % (08/09 0900) FiO2 (%):  [40 %-60 %] 40 % (08/09 0900) Weight:  [79.3 kg (174 lb 13.2 oz)] 79.3 kg (174 lb 13.2 oz) (08/08 2205)  General: A&OX3, lying on bed with hard neck collar Skin: Warm Lungs: CTAB Cor: RRR, No murmurs, rubs, gallops Abdomen: Soft, non distended, non tender, bs+ Extremities: pulses 2+ b/l  Lab Results Lab Results  Component Value Date   WBC 11.8* 06/06/2012   HGB 12.8* 06/06/2012   HCT 36.9* 06/06/2012   MCV 88.9 06/06/2012   PLT 351 06/06/2012    Lab Results  Component Value Date   CREATININE 0.85 06/06/2012   BUN 17 06/06/2012   NA 140 06/06/2012   K 4.0 06/06/2012   CL 98 06/06/2012   CO2 31 06/06/2012    No results found for this basename: ALT, AST, GGT, ALKPHOS, BILITOT       Microbiology: Recent Results (from the past 240 hour(s))  CULTURE, BLOOD (ROUTINE X 2)     Status: Normal (Preliminary result)   Collection Time   06/05/12  7:25 PM      Component Value Range Status Comment   Specimen Description BLOOD RIGHT ARM   Final    Special Requests BOTTLES DRAWN AEROBIC AND ANAEROBIC 10CC   Final    Culture  Setup Time 06/06/2012 04:00   Final    Culture     Final    Value:        BLOOD CULTURE RECEIVED NO GROWTH TO DATE CULTURE WILL BE HELD FOR 5 DAYS BEFORE ISSUING A FINAL NEGATIVE REPORT   Report Status PENDING   Incomplete   CULTURE, BLOOD (ROUTINE X 2)     Status: Normal (Preliminary result)   Collection Time   06/05/12  7:34 PM      Component Value Range Status Comment   Specimen Description BLOOD RIGHT ARM   Final    Special Requests BOTTLES DRAWN AEROBIC AND ANAEROBIC 10CC   Final    Culture  Setup Time 06/06/2012 04:00   Final    Culture     Final    Value:  BLOOD CULTURE RECEIVED NO GROWTH TO DATE CULTURE WILL BE HELD FOR 5 DAYS BEFORE ISSUING A FINAL NEGATIVE REPORT   Report Status PENDING   Incomplete   GRAM STAIN     Status: Normal   Collection Time   06/06/12  5:12 PM      Component Value Range Status Comment   Specimen Description WOUND ASPIRATE NECK   Final    Special Requests NONE   Final    Gram Stain     Final    Value: ABUNDANT WBC PRESENT,BOTH PMN AND MONONUCLEAR     ABUNDANT GRAM POSITIVE COCCI IN PAIRS IN CHAINS   Report Status 06/06/2012 FINAL   Final   WOUND CULTURE     Status: Normal (Preliminary result)   Collection Time   06/06/12  5:12 PM      Component Value Range Status Comment   Specimen Description WOUND ASPIRATE NECK   Final    Special Requests NONE   Final    Gram Stain     Final    Value: ABUNDANT WBC PRESENT,BOTH PMN AND MONONUCLEAR     ABUNDANT GRAM POSITIVE COCCI IN PAIRS AND CHAINS     Performed at [facility]   Culture Culture reincubated for better growth   Final    Report Status PENDING   Incomplete    ANAEROBIC CULTURE     Status: Normal (Preliminary result)   Collection Time   06/06/12  5:12 PM      Component Value Range Status Comment   Specimen Description WOUND ASPIRATE NECK   Final    Special Requests NONE   Final    Gram Stain     Final    Value: ABUNDANT WBC PRESENT,BOTH PMN AND MONONUCLEAR     ABUNDANT GRAM POSITIVE COCCI IN PAIRS AND CHAINS     Performed at [facility]   Culture     Final    Value: NO ANAEROBES ISOLATED; CULTURE IN PROGRESS FOR 5 DAYS   Report Status PENDING   Incomplete     Studies/Results: Dg Cervical Spine 1 View  06/07/2012  *RADIOLOGY REPORT*  Clinical Data: Post surgical neck pain, difficulty swallowing  DG CERVICAL SPINE - 1 VIEW  Comparison: C-arm spot films of 06/06/2012  Findings: An anterior metallic fixation plate is present extending from C3-C6 with corpectomy at C4-5.  There is significant prevertebral soft tissue swelling most likely representing postoperative hematoma.  IMPRESSION:  1.  Probable postoperative prevertebral hematoma. 2.  Anterior fusion from C3-C6 with normal alignment.  Original Report Authenticated By: Juline Patch, M.D.   Dg Cervical Spine 2-3 Views  06/07/2012  *RADIOLOGY REPORT*  Clinical Data: Status post cervical fusion.  Postoperative film.  CERVICAL SPINE - 2-3 VIEW  Comparison: Multiple priors, most recently 05/2012.  Findings: Postoperative changes of corpectomy at C4-C5, anterior plate and screw fixation extending from C3-C6 with fixation screws at C3 and C6, and posterior cervical fusion from C2-C7 are redemonstrated.  There continues to be prevertebral soft tissue swelling, likely representative of a combination of postoperative hematoma and edema.  The patient remains intubated.  Gas is noted in the soft tissues posterior to C2-C3.  IMPRESSION: 1.  Extensive postoperative changes of the cervical spinal fusion, as detailed above.  Prevertebral soft tissue swelling is persistent, but appears slightly decreased.   Whether or not this is because of resolving postoperative hemorrhage/edema, or simply because of the presence of the indwelling endotracheal tube is uncertain.  Original  Report Authenticated By: Florencia Reasons, M.D.   Dg Cervical Spine 2-3 Views  06/07/2012  *RADIOLOGY REPORT*  Clinical Data: Posterior cervical fusion.  CERVICAL SPINE - 2-3 VIEW  Comparison: Intraoperative films from 06/06/2012.  Findings: The first image demonstrates postoperative changes of C4- C5 corpectomy with an anterior plate and screw fixation device in place with screws in C3 and C6.  Alignment appears anatomic.  The second image demonstrates a surgical probe in place posterior to the C6 vertebral body.  The final image demonstrates a posterior rod and pedicle screw fixation devices in place bilaterally extending from C2-C7, with pedicle screws in place at C2, C3, C4, C5 and C7.  IMPRESSION: 1.  Intraoperative documentation of posterior cervical fusion, as above.  Original Report Authenticated By: Florencia Reasons, M.D.   Dg Cervical Spine 2-3 Views  06/06/2012  *RADIOLOGY REPORT*  Clinical Data: C3-C6 fusion, C4-5 corpectomy  DG C-ARM GT 120 MIN,CERVICAL SPINE - 2-3 VIEW  Technique: Spot fluoroscopic intraoperative views  Comparison:  06/05/2012  Findings: Anterior plate and screw fixation spans from C3-C6 following C4-5 corpectomy.  On the lateral view, there is a radiopaque surgicalsponge in the neck soft tissues anteriorly. Alignment appears anatomic.  Hardware intact.  IMPRESSION: C4-5 corpectomy with plate screw fixation from C3-C6.  Expected postoperative appearance and normal alignment  Original Report Authenticated By: Judie Petit. Ruel Favors, M.D.   Dg Chest Port 1 View  06/07/2012  *RADIOLOGY REPORT*  Clinical Data: Endotracheal tube placement during the cervical spine surgery.  PORTABLE CHEST - 1 VIEW  Comparison: None.  Findings: Endotracheal tube with tip about 7.6 cm above the carina. Normal heart size and pulmonary  vascularity.  Left costophrenic angle is not included within the field of view.  Visualized lungs appear clear and expanded.  No focal airspace consolidation or edema.  No pneumothorax.  Postoperative changes in the cervical spine.  IMPRESSION: Endotracheal tube tip is about 7.6 cm above the carina.  Original Report Authenticated By: Marlon Pel, M.D.   Dg C-arm Gt 120 Min  06/06/2012  *RADIOLOGY REPORT*  Clinical Data: C3-C6 fusion, C4-5 corpectomy  DG C-ARM GT 120 MIN,CERVICAL SPINE - 2-3 VIEW  Technique: Spot fluoroscopic intraoperative views  Comparison:  06/05/2012  Findings: Anterior plate and screw fixation spans from C3-C6 following C4-5 corpectomy.  On the lateral view, there is a radiopaque surgicalsponge in the neck soft tissues anteriorly. Alignment appears anatomic.  Hardware intact.  IMPRESSION: C4-5 corpectomy with plate screw fixation from C3-C6.  Expected postoperative appearance and normal alignment  Original Report Authenticated By: Judie Petit. Ruel Favors, M.D.    Assessment: Mr. Liberto 61 yo male with no significant PMH with C4-5 diskitis, status post C4 and C5 corpectomy  with anterior strut grafting and instrumentation on 08/07 and posterior cervical spinal fusion on 08/08 with post operative complication of respiratory failure who was intubated and extubated this morning. He has been afebrile since his admission with normal finding on chest X ray. He has a WBC count of 11.8 today. He has positive wound aspirate from his neck showing gram positive cocci with cultures pending. Blood cultures are still pending. He is on Vancomycin and Ceftriaxone broad coverage for gram positive cocci. Hewould need these antibiotics for 4-6 weeks depending on his cultures.   Plan: 1) Continue Ceftriaxone and Vancomycin 2) f/u with blood and wound cultures  Would discuss the plan with Dr. Karlton Lemon,  Medical student 06/08/2012, 11:47 AM  INFECTIOUS DISEASE ATTENDING  ADDENDUM:     [facility]   Date: 06/08/2012  Patient name: Patrick Richards  Medical record number: [id number]  Date of birth: October 20, 1951    This patient has been seen and discussed with the house staff. Please see their note for complete details. I concur with their findings with the following additions/corrections:  Patient now extubated. ID not yet back on organism. Continue current abx. He will need at least 8 weeks of IV antibiotics followed by oral abx. I will add rifampin if he grows MRSA or MSSA from culture given he now has hardware (screws in place)  Dr. [name] will be covering this weekend and is available for questions.   Acey Lav 06/08/2012, 5:34 PM

## 2012-06-08 NOTE — Evaluation (Signed)
Clinical/Bedside Swallow Evaluation Patient Details  Name: Patrick Richards MRN: [id number] Date of Birth: [birth year]  Today's Date: 06/08/2012 Time: 4098-1191 SLP Time Calculation (min): 22 min  Past Medical History:  Past Medical History  Diagnosis Date  . Anxiety     "situational"   Past Surgical History:  Past Surgical History  Procedure Date  . No past surgeries   . Anterior cervical decomp/discectomy fusion 06/06/2012    Procedure: ANTERIOR CERVICAL DECOMPRESSION/DISCECTOMY FUSION 1 LEVEL;  Surgeon: Emilee Hero, MD;  Location: [facility] OR;  Service: Orthopedics;  Laterality: N/A;  Cervical four and five corpectomies, I & D, placement anterior plate and screws cervical three-six.  Marland Kitchen Posterior cervical fusion/foraminotomy 06/07/2012    Procedure: POSTERIOR CERVICAL FUSION/FORAMINOTOMY LEVEL 4;  Surgeon: Emilee Hero, MD;  Location: [facility] OR;  Service: Orthopedics;  Laterality: N/A;  C3-C7 Posterior Cervical Fusion with Instrumentation   HPI:  61 year old male admitted with C4-5 diskitis. s/p C4 and C5 corpectomy  with anterior instrumentation with strut grafting 8/7 and posterior intrumentation with fusion on 8/8. Patient self extubated with inflated cuff on 8/9 am.     Assessment / Plan / Recommendation Clinical Impression  Patient presents with clinical indicators of a moderate pharyngeal based dysphagia likely structural in nature due to post-op pharyngeal swelling and potential presence of cervical hardware. Patient with multiple immediate dry swallow per bites and sip suggestive of pharyngeal residuals as well as intermittent throat clearing (solids > liquids) suggestive of penetration/aspiration. Despite this however, vocal quality remains clear and suspect that patient independently compensating for difficulty and utilizing throat clear to protect airway. Recent self-extubation with inflated cuff certainly does however further increase risk of aspiration due to likely added  swelling and potential damage to vocal cords. Recommend initiation of clear liquid diet only with strict use of safe swallowing precautions and close monitoring at bedside for increased s/s of aspiration and clinicial indicators of an aspiration related infection (congestion, temp spike). SLP will f/u at bedside 8/10 for diet tolerance and potential to advance. Suspect that with decreased edema, swallowing will return to baseline however pending length of time, objective evaluation may be beneficial.     Aspiration Risk  Moderate    Diet Recommendation Thin liquid (clear liquids)   Liquid Administration via: Cup;No straw Medication Administration: Crushed with puree Supervision: Patient able to self feed;Full supervision/cueing for compensatory strategies Compensations: Slow rate;Small sips/bites Postural Changes and/or Swallow Maneuvers: Seated upright 90 degrees;Upright 30-60 min after meal    Other  Recommendations Oral Care Recommendations: Oral care BID   Follow Up Recommendations  Other (comment) (TBD)    Frequency and Duration min 3x week  2 weeks   Pertinent Vitals/Pain n/a    SLP Swallow Goals Patient will consume recommended diet without observed clinical signs of aspiration with: Modified independent assistance Swallow Study Goal #1 - Progress: Not Met Patient will utilize recommended strategies during swallow to increase swallowing safety with: Modified independent assistance Swallow Study Goal #2 - Progress: Not met   Swallow Study    General HPI: 61 year old male admitted with C4-5 diskitis. s/p C4 and C5 corpectomy  with anterior instrumentation with strut grafting 8/7 and posterior intrumentation with fusion on 8/8. Patient self extubated with inflated cuff on 8/9 am.   Type of Study: Bedside swallow evaluation Diet Prior to this Study: NPO Temperature Spikes Noted: No Respiratory Status: Supplemental O2 delivered via (comment) (4 L nasal cannula) History of  Recent Intubation: Yes Length of  Intubations (days): 3 days Date extubated: 06/08/12 (self extubated) Behavior/Cognition: Cooperative;Alert;Pleasant mood;Impulsive Oral Cavity - Dentition: Adequate natural dentition Self-Feeding Abilities: Able to feed self Patient Positioning: Upright in bed Baseline Vocal Quality: Hoarse (mild ) Volitional Cough: Strong Volitional Swallow: Able to elicit    Oral/Motor/Sensory Function Overall Oral Motor/Sensory Function: Appears within functional limits for tasks assessed   Ice Chips Ice chips: Impaired Presentation: Self Fed;Spoon Pharyngeal Phase Impairments: Throat Clearing - Immediate (multiple swallows indicative of pharyngeal residuals)   Thin Liquid Thin Liquid: Impaired Presentation: Cup;Self Fed Pharyngeal  Phase Impairments: Multiple swallows;Throat Clearing - Delayed (multiple swallows indicative of pharyngeal residuals)    Nectar Thick Nectar Thick Liquid: Not tested   Honey Thick Honey Thick Liquid: Not tested     Puree Puree: Impaired Presentation: Spoon;Self Fed Pharyngeal Phase Impairments: Multiple swallows;Throat Clearing - Immediate (multiple swallows indicative of pharyngeal residuals)   Solid   GO    Solid: Impaired Presentation: Self Fed Pharyngeal Phase Impairments: Multiple swallows;Throat Clearing - Immediate (multiple swallows indicative of pharyngeal residuals)      Bonni Neuser MA, CCC-SLP [phone]  [name] 06/08/2012,3:30 PM

## 2012-06-08 NOTE — Op Note (Signed)
NAMESOLON, ALBAN                 ACCOUNT NO.:  [id number]  MEDICAL RECORD NO.:  [id number]  LOCATION:  3104                         FACILITY:  [facility]  PHYSICIAN:  Estill Bamberg, MD      DATE OF BIRTH:  [birth year]  DATE OF PROCEDURE:  06/07/2012 DATE OF DISCHARGE:                              OPERATIVE REPORT   PREOPERATIVE DIAGNOSIS:  C4-5 diskitis, status post C4 and C5 corpectomy with anterior strut grafting and instrumentation, requiring additional stabilization and fusion.  POSTOPERATIVE DIAGNOSIS:  C4-5 diskitis, status post C4 and C5 corpectomy with anterior strut grafting and instrumentation, requiring additional stabilization and fusion.  PROCEDURE: 1. C2-3, C3-4, C4-5, C5-6, C6-7 posterior cervical fusion. 2. Placement of posterior instrumentation, C2-C7. 3. Intraoperative use of fluoroscopy. 4. Placement and removal of Mayfield head holder. 5. Intraoperative bone marrow aspirate from the patient's right iliac     crest using a separate incision. 6. Use of local autograft. 7. Bilateral C6-7 laminotomy, partial facetectomy, and foraminotomy.  SURGEON:  Estill Bamberg, MD  ASSISTANT:  Janace Litten, OPA  ANESTHESIA:  General endotracheal anesthesia.  COMPLICATIONS:  None.  DISPOSITION:  Stable.  ESTIMATED BLOOD LOSS:  200 mL.  INDICATIONS FOR PROCEDURE:  Briefly, Mr. Barnhard is a very pleasant 61- year-old male who was admitted to [facility] with a diagnosis of C4-5 diskitis.  Please refer to my consultation note and my previous operative report for full account of the patient's condition and requirement for surgery.  The patient did, however, have a C4 and C5 corpectomy on June 06, 2012, with anterior instrumentation with strut grafting.  Given the length of the surgery, it was decided that the posterior procedure will be performed the following day.  The patient was, therefore, brought to surgery on June 07, 2012, for posterior instrumentation and  fusion.  OPERATIVE DETAILS:  On June 07, 2012, the patient was brought to Surgery and general endotracheal anesthesia was administered.  Mayfield head holder was applied by me and the patient was turned prone onto a hospital bed.  The head was secured to the Mayfield head holder in the usual fashion.  The patient's arms were secured to the sides.  The shoulders were taped to the inferior aspect of the bed.  The neck was placed into a gentle degree of extension.  The neck was then prepped and draped in the usual sterile fashion.  A time-out procedure was performed and SCDs were placed.  I then made an incision from approximately the spinous process of C2 to approximately the spinous process of T1.  The fascia was sharply incised.  The paraspinal musculature was bluntly swept laterally on both the right and the left sides.  The lamina of C2- C7 was subperiosteally exposed.  The C2-3 to the C6-7 facet joints were subperiosteally exposed as well.  At this point, I did use a 1.7-mm bur to decorticate the facet joints to be fused from C2-C7.  I then used a 1.7-mm bur followed by 3-mm tap to cannulate the lateral masses of C3, C4, and C5.  The trajectory of the cannulated lateral masses was approximately 30 degrees cephalad and 30 degrees laterally.  A  ball-tip probe confirmed that there was no violation of the cortices.  I then performed a laminotomy at the C6-7 level bilaterally in order to decompress the exiting C7 nerve and also in order to palpate the C7 pedicle.  This was done on both the right and the left sides.  I then used a 1.7-mm bur followed by gearshift probe followed by 3.5-mm tap to cannulate the C7 pedicles using anatomic landmarks and palpation of the pedicle.  I then cannulated the C2 pars in the manner described previously for the C7 pedicle screws.  At this point, I did turn my attention towards the patient's right iliac crest.  An incision was made and I did aspirate 8 mL  of bone marrow aspirate from the patient's right iliac crest.  This was mixed with 10 mL of Vitoss BA.  I then used a 3- mm bur to decorticate the posterior elements in the facet joints of the levels to be fused.  I then placed 3.5 x 14-mm screws at C3, C4, and C5. I placed a 4 x 22-mm screw bilaterally at C7 and a 3.5 x 16-mm screw was placed at C2 bilaterally.  I then packed the Vitoss and bone marrow aspirate mixture along the posterior elements into the facet joints to be fused.  Of note, I did remove autograft from the posterior elements and from the decompression at the C6-7 level in order to the optimize fusion.  Once all the screws were placed, I did fashion a 3.5-mm rod to the appropriate position.  The rod was secured into the heads of the screws.  Caps were placed and then, a final locking procedure was performed.  This was done on both the right and the left sides.  Of particular note, I did relax the retractors approximately every 30 minutes throughout the procedure.  I did also irrigate the wound copiously throughout the procedure.  In total, I did irrigate approximately 2 liters of normal saline.  I then obtained a lateral intraoperative radiograph and I was very pleased with the appearance of the radiograph.  I then controlled all bleeding using bipolar.  I then closed the fascia using #1 Vicryl.  The subcutaneous layer was closed using 2-0 Vicryl, and the skin was closed using 4-0 Monocryl.  All instrument counts were correct at the termination of the procedure.  Of note, Janace Litten was my assistant throughout the procedure from start to finish, in order to aid in essential retraction and suctioning required throughout the entirety of the surgery.     Estill Bamberg, MD     MD/MEDQ  D:  06/07/2012  T:  06/08/2012  Job:  [id number]

## 2012-06-08 NOTE — Progress Notes (Signed)
Name: Patrick Richards MRN: [id number] DOB: February 11, 1951    LOS: 3  Referring Provider:  Jacklynn Ganong) Reason for Referral:  Postop respiratory failure  PULMONARY / CRITICAL CARE MEDICINE  HPI:  61 yo with osteomyelitis C4-C5, discitis and prevertebral abscess s/p spinal surgery brought to ICU intubated.  Brief patient description:  61 yo with osteomyelitis C4-C5, discitis and prevertebral abscess s/p spinal surgery brought to ICU intubated.  Events Since Admission: 8/8  S/p spinal surgery brought to ICU intubated.  Current Status: Stable  Vital Signs: Temp:  [98.1 F (36.7 C)-99.5 F (37.5 C)] 99 F (37.2 C) (08/09 0700) Pulse Rate:  [94-110] 105  (08/09 0900) Resp:  [12-21] 12  (08/09 0900) BP: (133-163)/(74-113) 160/85 mmHg (08/09 0900) SpO2:  [100 %] 100 % (08/09 0900) FiO2 (%):  [40 %-60 %] 40 % (08/09 0900) Weight:  [79.3 kg (174 lb 13.2 oz)] 79.3 kg (174 lb 13.2 oz) (08/08 2205)  Physical Examination: General:  Mechanically ventilated, sunchronous Neuro:  Alert and interactive. HEENT:  PERRL Neck:  Neck collar / drain in place Cardiovascular:  RRR. Lungs:  Coarse BS diffusely. Abdomen:  Soft, non tender, bowel sounds present. Musculoskeletal:  No edema. Skin:  Intact.  Principal Problem:  *Diskitis Active Problems:  Neck pain  Anxiety  Postoperative respiratory failure  Encephalopathy acute  ASSESSMENT AND PLAN  PULMONARY  Lab 06/07/12 0013  PHART 7.502*  PCO2ART 33.8*  PO2ART 172.0*  HCO3 26.3*  O2SAT 100.0   Ventilator Settings: Vent Mode:  [-] PSV FiO2 (%):  [40 %-60 %] 40 % Set Rate:  [14 bmp-16 bmp] 16 bmp Vt Set:  [600 mL-630 mL] 630 mL PEEP:  [5 cmH20] 5 cmH20 Pressure Support:  [5 cmH20] 5 cmH20 Plateau Pressure:  [10 cmH20-13 cmH20] 10 cmH20  CXR:  8/8 >>> ETT:  8/8 >>>8/9  A:  Acute postop respiratory failure. P:   Extubate IS and flutter valve Titrate O2 for sat of 88-92% Swallow evaluation  CARDIOVASCULAR No results found  for this basename: TROPONINI:5,LATICACIDVEN:5, O2SATVEN:5,PROBNP:5 in the last 168 hours  ECG:  NA Lines: NA  A: Hemodynamically stable.  No arrhythmia / ischemia. P:  Telemetry  RENAL  Lab 06/06/12 0550  NA 140  K 4.0  CL 98  CO2 31  BUN 17  CREATININE 0.85  CALCIUM 9.2  MG --  PHOS --   Intake/Output      08/08 0701 - 08/09 0700 08/09 0701 - 08/10 0700   P.O.     I.V. (mL/kg) 3413.8 (43) 61.5 (0.8)   IV Piggyback 910 150   Total Intake(mL/kg) 4323.8 (54.5) 211.5 (2.7)   Urine (mL/kg/hr) 3680 (1.9) 250   Drains 17    Blood 450    Total Output 4147 250   Net +176.8 -38.5         Foley:  8/8 >>>  A:  Normal renal function / electrolytes. P:   Trend BMP and replace electrolytes accordingly.  GASTROINTESTINAL No results found for this basename: AST:5,ALT:5,ALKPHOS:5,BILITOT:5,PROT:5,ALBUMIN:5 in the last 168 hours  A: No active issues. P:   Swallow evaluation since patient has a C-collar on and self extubated with cuff up.  HEMATOLOGIC  Lab 06/06/12 0550 06/05/12 1956  HGB 12.8* 13.4  HCT 36.9* 37.9*  PLT 351 378  INR -- --  APTT -- --   A:  Mild anemia. P:  Trend CBC.  INFECTIOUS  Lab 06/06/12 0550 06/05/12 1956  WBC 11.8* 11.3*  PROCALCITON -- --   Cultures:  8/6  Blood >>> 8/7  Wound >>>  Antibiotics: Ceftriaxone 8/8 >>> Vancomycin 8/8 >>>  A:  Discitis.  Osteomyelitis.  Prevertebral abscess. P:   Per Ortho  ENDOCRINE No results found for this basename: GLUCAP:5 in the last 168 hours A:  No active issues. P:   No interventions required  NEUROLOGIC  A:  Acute encephalopathy secondary to sedation. P:   D/C sedation Pain control per Ortho Will defer ambulation/OOB to chair/PT/OT to orthopedics  The patient is critically ill with multiple organ systems failure and requires high complexity decision making for assessment and support, frequent evaluation and titration of therapies, application of advanced monitoring technologies and  extensive interpretation of multiple databases. Critical Care Time devoted to patient care services described in this note is 30 minutes.  Koren Bound, M.D. Pulmonary and Critical Care Medicine [facility] Pager: [phone]  06/08/2012, 10:10 AM

## 2012-06-09 LAB — CBC
HCT: 33.1 % — ABNORMAL LOW (ref 39.0–52.0)
MCHC: 34.7 g/dL (ref 30.0–36.0)
MCV: 87.6 fL (ref 78.0–100.0)
RDW: 12.8 % (ref 11.5–15.5)

## 2012-06-09 LAB — WOUND CULTURE

## 2012-06-09 LAB — BASIC METABOLIC PANEL
BUN: 9 mg/dL (ref 6–23)
Creatinine, Ser: 0.71 mg/dL (ref 0.50–1.35)
GFR calc Af Amer: >90 mL/min (ref >90)
GFR calc non Af Amer: >90 mL/min (ref >90)

## 2012-06-09 MED ORDER — POTASSIUM CHLORIDE IN NACL 40-0.9 MEQ/L-% IV SOLN
INTRAVENOUS | Status: DC
  Administered 2012-06-09 – 2012-06-10 (×3): via INTRAVENOUS
  Filled 2012-06-09 (×8): qty 1000

## 2012-06-09 MED ORDER — DIAZEPAM 5 MG PO TABS
5.0000 mg | ORAL_TABLET | Freq: Four times a day (QID) | ORAL | Status: DC | PRN
  Administered 2012-06-11: 5 mg via ORAL
  Filled 2012-06-09: qty 1

## 2012-06-09 MED ORDER — ZOLPIDEM TARTRATE 5 MG PO TABS
5.0000 mg | ORAL_TABLET | Freq: Every evening | ORAL | Status: DC | PRN
  Administered 2012-06-09 – 2012-06-10 (×2): 10 mg via ORAL
  Filled 2012-06-09 (×2): qty 2

## 2012-06-09 MED ORDER — POTASSIUM CHLORIDE 20 MEQ/15ML (10%) PO LIQD
40.0000 meq | Freq: Once | ORAL | Status: AC
  Administered 2012-06-09: 40 meq via ORAL
  Filled 2012-06-09: qty 30

## 2012-06-09 MED ORDER — BOOST / RESOURCE BREEZE PO LIQD
1.0000 | Freq: Three times a day (TID) | ORAL | Status: DC
Start: 2012-06-09 — End: 2012-06-11
  Administered 2012-06-09 – 2012-06-11 (×7): 1 via ORAL

## 2012-06-09 NOTE — Progress Notes (Signed)
Infectious Diseases Progress Note  - cultures showing microaerophilic strep from neck abscess cx   Recs: - will continue with ceftriaxone 2gm IV daily only; will discontinue vancomycin   Yailene Badia B. [name] MD MPH [facility] [phone] pager [phone] cell

## 2012-06-09 NOTE — Progress Notes (Addendum)
Name: Patrick Richards MRN: [id number] DOB: August 08, 1951    LOS: 4  Referring Provider:  Jacklynn Ganong) Reason for Referral:  Postop respiratory failure  PULMONARY / CRITICAL CARE MEDICINE  HPI:  61 yo with osteomyelitis C4-C5, discitis and prevertebral abscess s/p spinal surgery brought to ICU intubated.  Brief patient description:  61 yo with osteomyelitis C4-C5, discitis and prevertebral abscess s/p spinal surgery brought to ICU intubated.  Events Since Admission: 8/8  S/p spinal surgery brought to ICU intubated. 06/08/12 - extubated   Current Status:  06/09/12 - ok for clears. No resp issues. Wants to move out of ICU. Has hard collar on  Vital Signs: Temp:  [98.3 F (36.8 C)-99.7 F (37.6 C)] 98.9 F (37.2 C) (08/10 0400) Pulse Rate:  [98-114] 100  (08/10 0700) Resp:  [12-24] 19  (08/10 0700) BP: (118-167)/(74-132) 139/80 mmHg (08/10 0700) SpO2:  [95 %-100 %] 98 % (08/10 0700) FiO2 (%):  [40 %] 40 % (08/09 0900)  Physical Examination: General:  Looks chronically unwll but pleasant no distress Neuro:  Alert and interactive. Talking. Cheerful.  Smiling HEENT:  PERRL Neck:  Neck collar / drain in place Cardiovascular:  RRR. Lungs:  Coarse BS diffusely. Abdomen:  Soft, non tender, bowel sounds present. Musculoskeletal:  No edema. Skin:  Intact.  Principal Problem:  *Diskitis Active Problems:  Neck pain  Anxiety  Postoperative respiratory failure  Encephalopathy acute  ASSESSMENT AND PLAN  PULMONARY  Lab 06/07/12 0013  PHART 7.502*  PCO2ART 33.8*  PO2ART 172.0*  HCO3 26.3*  O2SAT 100.0   Ventilator Settings: Vent Mode:  [-] PSV FiO2 (%):  [40 %] 40 % PEEP:  [5 cmH20] 5 cmH20 Pressure Support:  [5 cmH20] 5 cmH20  CXR:  8/8 >>> ETT:  8/8 >>>8/9  A:  Acute postop respiratory failure.  - on 06/09/12 - doing well on RA. No resp issues P:   IS and flutter valve Repeat Swallow evaluation pending 06/09/12  CARDIOVASCULAR No results found for this basename:  TROPONINI:5,LATICACIDVEN:5, O2SATVEN:5,PROBNP:5 in the last 168 hours  ECG:  NA Lines: NA  A: Hemodynamically stable.  No arrhythmia / ischemia. P:  Telemetry  RENAL  Lab 06/09/12 0430 06/06/12 0550  NA 136 140  K 3.3* 4.0  CL 99 98  CO2 24 31  BUN 9 17  CREATININE 0.71 0.85  CALCIUM 8.9 9.2  MG 2.2 --  PHOS 2.3 --   Intake/Output      08/09 0701 - 08/10 0700 08/10 0701 - 08/11 0700   I.V. (mL/kg) 1211.5 (15.3)    IV Piggyback 730    Total Intake(mL/kg) 1941.5 (24.5)    Urine (mL/kg/hr) 1200 (0.6)    Drains     Blood     Total Output 1200    Net +741.5         Urine Occurrence 2 x     Foley:  8/8 >>>  A:  Mild hypokalemia   kcl po crushed.  GASTROINTESTINAL No results found for this basename: AST:5,ALT:5,ALKPHOS:5,BILITOT:5,PROT:5,ALBUMIN:5 in the last 168 hours  A: No active issues. Swallow eval 8/9 cleared only for clears P:   Repeat swallow eval 06/09/12  HEMATOLOGIC  Lab 06/09/12 0430 06/06/12 0550 06/05/12 1956  HGB 11.5* 12.8* 13.4  HCT 33.1* 36.9* 37.9*  PLT 296 351 378  INR -- -- --  APTT -- -- --   A:  Mild anemia. P:  - PRBC for hgb </= 6.9gm%    - exceptions are   -  if ACS susepcted/confirmed then transfuse for hgb </= 8.0gm%,  or    -  If septic shock first 24h and scvo2 < 70% then transfuse for hgb </= 9.0gm%   - active bleeding with hemodynamic instability, then transfuse regardless of hemoglobin value   At at all times try to transfuse 1 unit prbc as possible with exception of active hemorrhage    INFECTIOUS  Lab 06/09/12 0430 06/06/12 0550 06/05/12 1956  WBC 14.8* 11.8* 11.3*  PROCALCITON -- -- --   Cultures: 8/6  Blood >>> 8/7  Wound >>>  Antibiotics: Ceftriaxone 8/8 >>> Vancomycin 8/8 >>>  A:  Discitis.  Osteomyelitis.  Prevertebral abscess. P:   Per Ortho  ENDOCRINE No results found for this basename: GLUCAP:5 in the last 168 hours A:  No active issues. P:   No interventions required  NEUROLOGIC  A:  Acute  encephalopathy secondary to sedation. P:   D/C sedation Pain control per Ortho Will defer ambulation/OOB to chair/PT/OT to orthopedics    PCCM will sign off  Dr. Kalman Shan, M.D., [facility].C.P Pulmonary and Critical Care Medicine Staff Physician [facility] [place] [facility] Pager: [phone], If no answer or between  15:00h - 7:00h: call [phone]  06/09/2012 8:00 AM

## 2012-06-09 NOTE — Progress Notes (Signed)
Patient transferred to [facility] via bed, when arrived to room, assisted patient out of bed to chair, slightly unsteady but first time out of bed since admitted to hospital, denied pain, 1+assist walked about 12 steps then to recliner, eager to ambulate later, callbell at side of patient, wife at bedside , 5N RN in room, Berle Mull RN

## 2012-06-09 NOTE — Progress Notes (Signed)
[facility] Progress Note [place] TEAM 1 - Stepdown/ICU TEAM   Patrick Richards NFA:[id number] DOB: Jun 14, 1951 DOA: 06/05/2012 PCP: Sid Falcon, MD  Brief narrative: 61 year old man without any significant past medical history was referred from orthopedist's office for neck pain and a prevertebral fluid collection on an MRI of the C-spine. The patient has had escalating neck pain for the past month. He denied any weakness in his upper extremity or lower extremities. He denied any fevers or chills.   Infectious disease and orthopedics were called in consultation and ultimately the patient was taken to the operating room on 06/07/2012.  The patient remained intubated after his procedure and therefore critical care attended to his medical care 06/07/2012 through 06/08/2012.  Assessment/Plan:  C4-C5 discitis/osteomyelitis/prevertebral abscess Status post C4-C5 corpectomy with I&D and placement of anterior instrumentation C3-C6 on 06/07/2012 - ID suggest 8 weeks of parenteral antibiotic therapy followed by an extended course of oral antibiotics - wound cultures are now revealing microaerophilic strep - blood cultures remain negative thus far  Acute post operative respiratory failure The patient remained intubated after his surgical procedure on 06/07/2012 - he subsequently extubated himself with the ET tube cuff inflated on 06/08/2012  Self extubation/possible vocal cord trauma speech language pathology evaluation is ongoing - presently the patient has only been approved for clear thin liquids   Hypokalemia Replace and follow  Mild anemia Follow hemoglobin trend  Acute encephalopathy secondary to sedating medication  Code Status: Full Disposition Plan: Stable for transfer to orthopedic floor  Consultants: Infectious disease Orthopedics/Spine  Procedures: -C4-C5 corpectomy with I&D and placement of anterior instrumentation C3-C6 on 06/07/2012 -Self extubation  06/08/2012  Antibiotics: Ceftriaxone 8/8 >>>  Vancomycin 8/8 >>>  DVT prophylaxis: SCDs  HPI/Subjective: Patient is resting comfortably in bed at the present time.  He denies fevers chills nausea vomiting shortness of breath or chest pain.  He does complain of significant insomnia that was not a problem until he was admitted to the hospital.   Objective: Blood pressure 139/80, pulse 100, temperature 98.6 F (37 C), temperature source Oral, resp. rate 19, height 6\' 1"  (1.854 m), weight 79.3 kg (174 lb 13.2 oz), SpO2 98.00%.  Intake/Output Summary (Last 24 hours) at 06/09/12 0827 Last data filed at 06/09/12 0700  Gross per 24 hour  Intake   1730 ml  Output    950 ml  Net    780 ml    Exam: General: No acute respiratory distress - appears reasonably comfortable in cervical collar Lungs: Clear to auscultation bilaterally without wheezes or crackles Cardiovascular: Regular rate and rhythm without murmur gallop or rub  Abdomen: Nontender, nondistended, soft, bowel sounds positive, no rebound, no ascites, no appreciable mass Extremities: No significant cyanosis, clubbing, or edema bilateral lower extremities  Data Reviewed: Basic Metabolic Panel:  Lab 06/09/12 4696 06/06/12 0550  NA 136 140  K 3.3* 4.0  CL 99 98  CO2 24 31  GLUCOSE 141* 102*  BUN 9 17  CREATININE 0.71 0.85  CALCIUM 8.9 9.2  MG 2.2 --  PHOS 2.3 --   CBC:  Lab 06/09/12 0430 06/06/12 0550 06/05/12 1956  WBC 14.8* 11.8* 11.3*  NEUTROABS -- -- --  HGB 11.5* 12.8* 13.4  HCT 33.1* 36.9* 37.9*  MCV 87.6 88.9 87.7  PLT 296 351 378    Recent Results (from the past 240 hour(s))  CULTURE, BLOOD (ROUTINE X 2)     Status: Normal (Preliminary result)   Collection Time   06/05/12  7:25  PM      Component Value Range Status Comment   Specimen Description BLOOD RIGHT ARM   Final    Special Requests BOTTLES DRAWN AEROBIC AND ANAEROBIC 10CC   Final    Culture  Setup Time 06/06/2012 04:00   Final    Culture      Final    Value:        BLOOD CULTURE RECEIVED NO GROWTH TO DATE CULTURE WILL BE HELD FOR 5 DAYS BEFORE ISSUING A FINAL NEGATIVE REPORT   Report Status PENDING   Incomplete   CULTURE, BLOOD (ROUTINE X 2)     Status: Normal (Preliminary result)   Collection Time   06/05/12  7:34 PM      Component Value Range Status Comment   Specimen Description BLOOD RIGHT ARM   Final    Special Requests BOTTLES DRAWN AEROBIC AND ANAEROBIC 10CC   Final    Culture  Setup Time 06/06/2012 04:00   Final    Culture     Final    Value:        BLOOD CULTURE RECEIVED NO GROWTH TO DATE CULTURE WILL BE HELD FOR 5 DAYS BEFORE ISSUING A FINAL NEGATIVE REPORT   Report Status PENDING   Incomplete   GRAM STAIN     Status: Normal   Collection Time   06/06/12  5:12 PM      Component Value Range Status Comment   Specimen Description WOUND ASPIRATE NECK   Final    Special Requests NONE   Final    Gram Stain     Final    Value: ABUNDANT WBC PRESENT,BOTH PMN AND MONONUCLEAR     ABUNDANT GRAM POSITIVE COCCI IN PAIRS IN CHAINS   Report Status 06/06/2012 FINAL   Final   WOUND CULTURE     Status: Normal   Collection Time   06/06/12  5:12 PM      Component Value Range Status Comment   Specimen Description WOUND ASPIRATE NECK   Final    Special Requests NONE   Final    Gram Stain     Final    Value: ABUNDANT WBC PRESENT,BOTH PMN AND MONONUCLEAR     ABUNDANT GRAM POSITIVE COCCI IN PAIRS AND CHAINS     Performed at [facility]   Culture     Final    Value: MODERATE MICROAEROPHILIC STREPTOCOCCI     Note: Standardized susceptibility testing for this organism is not available.   Report Status 06/09/2012 FINAL   Final   ANAEROBIC CULTURE     Status: Normal (Preliminary result)   Collection Time   06/06/12  5:12 PM      Component Value Range Status Comment   Specimen Description WOUND ASPIRATE NECK   Final    Special Requests NONE   Final    Gram Stain     Final    Value: ABUNDANT WBC PRESENT,BOTH PMN AND MONONUCLEAR      ABUNDANT GRAM POSITIVE COCCI IN PAIRS AND CHAINS     Performed at [facility]   Culture     Final    Value: NO ANAEROBES ISOLATED; CULTURE IN PROGRESS FOR 5 DAYS   Report Status PENDING   Incomplete      Studies:  Recent x-ray studies have been reviewed in detail by the Attending Physician  Scheduled Meds:  Reviewed in detail by the Attending Physician   Lonia Blood, MD [facility] Office  [phone] Pager [phone]  On-Call/Text  Page:      [email or web address]      password [email or web address]  If 7PM-7AM, please contact night-coverage [email or web address] Password [email or web address] 06/09/2012, 8:27 AM   LOS: 4 days

## 2012-06-09 NOTE — Progress Notes (Signed)
PATIENT ID: Patrick Richards   2 Days Post-Op Procedure(s) (LRB): POSTERIOR CERVICAL FUSION/FORAMINOTOMY LEVEL 4 (N/A)  Subjective: Extubated.  Doing well.  No difficulty with breathing or swallowing.  Objective:  Filed Vitals:   06/09/12 0700  BP: 139/80  Pulse: 100  Temp:   Resp: 19     Collar/dressings intact. No sig anterior swelling.  BLU and BLE NVI good strength.  Labs:   [facility] 06/09/12 0430  HGB 11.5*   Basename 06/09/12 0430  WBC 14.8*  RBC 3.78*  HCT 33.1*  PLT 296   Basename 06/09/12 0430  NA 136  K 3.3*  CL 99  CO2 24  BUN 9  CREATININE 0.71  GLUCOSE 141*  CALCIUM 8.9    Assessment and Plan:S/p anterior/post cervical fusion for discitis - OK to transfer to general floor from my standpoint, but would defer decision to primary service  - cx show microaerophilic strep, abx per ID team - ok to get OOB with PT

## 2012-06-09 NOTE — Progress Notes (Signed)
Speech Language Pathology Dysphagia Treatment Patient Details Name: Patrick Richards MRN: [id number] DOB: 1951/08/25 Today's Date: 06/09/2012 Time: 4098-1191 SLP Time Calculation (min): 27 min  Assessment / Plan / Recommendation Clinical Impression  Pt continues with moderate pharyngeal dysphagia with multiple dry swallows and intermittent throat clearing continuing with liquids and overt cough with solid cracker-? penetration to vocal folds and/or aspiration.  Phonation continuing to improve per spouse.  Pt with h/o throat clearing throughout meals that has improved with not consuming whole grain wheat bread per pt/spouse.    Rec continue clear liquid diet currently and monitor for readiness advancement at bedside.  It s/s of dysphagia does not resolve within next few days, pt may benefit from instrumental evaluation.  Pt also reported to eat rapidly, instructed pt to importance of slowing rate of po for maximal airway protection.    SLP to follow.  Thanks.     Diet Recommendation  Continue with Current Diet: Thin liquid (clear liquid)    SLP Plan Continue with current plan of care   Pertinent Vitals/Pain Afebrile, decreased   Swallowing Goals  SLP Swallowing Goals Swallow Study Goal #1 - Progress: Progressing toward goal Swallow Study Goal #2 - Progress: Progressing toward goal  General Temperature Spikes Noted: No Respiratory Status: Room air Behavior/Cognition: Alert;Cooperative;Pleasant mood Oral Cavity - Dentition: Adequate natural dentition  Oral Cavity - Oral Hygiene   oral cavity clear  Dysphagia Treatment Treatment focused on: Skilled observation of diet tolerance Treatment Methods/Modalities: Skilled observation Patient observed directly with PO's: Yes Type of PO's observed: Dysphagia 1 (puree);Thin liquids;Regular Feeding: Able to feed self Liquids provided via: Cup Pharyngeal Phase Signs & Symptoms: Multiple swallows;Immediate throat clear;Delayed cough (cough with  solid) Type of cueing: Verbal Amount of cueing: Moderate   GO    Donavan Burnet, MS [facility] SLP [phone]

## 2012-06-09 NOTE — Progress Notes (Signed)
Report given to [facility] RN on 5N , to transfer to [id number], Berle Mull RN

## 2012-06-10 LAB — BASIC METABOLIC PANEL
BUN: 10 mg/dL (ref 6–23)
CO2: 27 mEq/L (ref 19–32)
Calcium: 8.5 mg/dL (ref 8.4–10.5)
Chloride: 103 mEq/L (ref 96–112)
Creatinine, Ser: 0.66 mg/dL (ref 0.50–1.35)

## 2012-06-10 LAB — CBC
HCT: 29.8 % — ABNORMAL LOW (ref 39.0–52.0)
MCH: 29.9 pg (ref 26.0–34.0)
MCHC: 33.6 g/dL (ref 30.0–36.0)
MCV: 89 fL (ref 78.0–100.0)
Platelets: 305 10*3/uL (ref 150–400)
RDW: 13.1 % (ref 11.5–15.5)
WBC: 14.2 10*3/uL — ABNORMAL HIGH (ref 4.0–10.5)

## 2012-06-10 MED ORDER — SODIUM CHLORIDE 0.9 % IJ SOLN
10.0000 mL | INTRAMUSCULAR | Status: DC | PRN
  Administered 2012-06-11 (×2): 10 mL

## 2012-06-10 MED ORDER — SODIUM CHLORIDE 0.9 % IJ SOLN: 10.0000 mL | Freq: Two times a day (BID) | INTRAMUSCULAR | Status: DC

## 2012-06-10 MED ORDER — ZOLPIDEM TARTRATE 5 MG PO TABS: 5.0000 mg | ORAL_TABLET | Freq: Every day | ORAL | Status: DC

## 2012-06-10 NOTE — Progress Notes (Signed)
[facility] Progress Note [place] TEAM 1 - Stepdown/ICU TEAM   Patrick Richards WUJ:[id number] DOB: [birth year] DOA: 06/05/2012 PCP: Sid Falcon, MD  Brief narrative: 61 year old man without any significant past medical history was referred from orthopedist's office for neck pain and a prevertebral fluid collection on an MRI of the C-spine. The patient has had escalating neck pain for the past month. He denied any weakness in his upper extremity or lower extremities. He denied any fevers or chills.   Infectious disease and orthopedics were called in consultation and ultimately the patient was taken to the operating room on 06/07/2012.  The patient remained intubated after his procedure and therefore critical care attended to his medical care 06/07/2012 through 06/08/2012.  Assessment/Plan:  C4-C5 discitis/osteomyelitis/prevertebral abscess Status post C4-C5 corpectomy with I&D and placement of anterior instrumentation C3-C6 on 06/07/2012 - ID suggest 8 weeks of parenteral antibiotic therapy followed by an extended course of oral antibiotics - wound cultures are now revealing microaerophilic strep - blood cultures remain negative thus far., PICC line ordered  Acute post operative respiratory failure The patient remained intubated after his surgical procedure on 06/07/2012 - he subsequently extubated himself with the ET tube cuff inflated on 06/08/2012  Self extubation/possible vocal cord trauma speech language pathology evaluation is ongoing - presently the patient has only been approved for clear thin liquids , his issues issue more may be more related to inflammation postop surgery.  Hypokalemia Replace and follow  Mild anemia Follow hemoglobin trend  Acute encephalopathy secondary to sedating medication Resolved  Code Status: Full Disposition Plan: Stable for transfer to orthopedic floor  Consultants: Infectious disease Orthopedics/Spine  Procedures: -C4-C5 corpectomy  with I&D and placement of anterior instrumentation C3-C6 on 06/07/2012 -Self extubation 06/08/2012  Antibiotics: Ceftriaxone 8/8 >>>  Vancomycin 8/8 >>>  DVT prophylaxis: SCDs  HPI/Subjective: Resting comfortably. He has no complaints other than some mild insomnia. Is hoping to be able to go home soon.   Objective: Blood pressure 145/69, pulse 92, temperature 99.9 F (37.7 C), temperature source Oral, resp. rate 18, height 6\' 1"  (1.854 m), weight 79.3 kg (174 lb 13.2 oz), SpO2 97.00%.  Intake/Output Summary (Last 24 hours) at 06/10/12 1721 Last data filed at 06/10/12 1200  Gross per 24 hour  Intake    560 ml  Output    200 ml  Net    360 ml    Exam: General: No acute respiratory distress - appears reasonably comfortable in cervical collar Lungs: Clear to auscultation bilaterally without wheezes or crackles Cardiovascular: Regular rate and rhythm without murmur gallop or rub  Abdomen: Nontender, nondistended, soft, bowel sounds positive, no rebound, no ascites, no appreciable mass Extremities: No significant cyanosis, clubbing, or edema bilateral lower extremities  Data Reviewed: Basic Metabolic Panel:  Lab 06/10/12 9562 06/09/12 0430 06/06/12 0550  NA 138 136 140  K 4.3 3.3* 4.0  CL 103 99 98  CO2 27 24 31   GLUCOSE 135* 141* 102*  BUN 10 9 17   CREATININE 0.66 0.71 0.85  CALCIUM 8.5 8.9 9.2  MG -- 2.2 --  PHOS -- 2.3 --   CBC:  Lab 06/10/12 0710 06/09/12 0430 06/06/12 0550 06/05/12 1956  WBC 14.2* 14.8* 11.8* 11.3*  NEUTROABS -- -- -- --  HGB 10.0* 11.5* 12.8* 13.4  HCT 29.8* 33.1* 36.9* 37.9*  MCV 89.0 87.6 88.9 87.7  PLT 305 296 351 378    Recent Results (from the past 240 hour(s))  CULTURE, BLOOD (ROUTINE X 2)  Status: Normal (Preliminary result)   Collection Time   06/05/12  7:25 PM      Component Value Range Status Comment   Specimen Description BLOOD RIGHT ARM   Final    Special Requests BOTTLES DRAWN AEROBIC AND ANAEROBIC 10CC   Final     Culture  Setup Time 06/06/2012 04:00   Final    Culture     Final    Value:        BLOOD CULTURE RECEIVED NO GROWTH TO DATE CULTURE WILL BE HELD FOR 5 DAYS BEFORE ISSUING A FINAL NEGATIVE REPORT   Report Status PENDING   Incomplete   CULTURE, BLOOD (ROUTINE X 2)     Status: Normal (Preliminary result)   Collection Time   06/05/12  7:34 PM      Component Value Range Status Comment   Specimen Description BLOOD RIGHT ARM   Final    Special Requests BOTTLES DRAWN AEROBIC AND ANAEROBIC 10CC   Final    Culture  Setup Time 06/06/2012 04:00   Final    Culture     Final    Value:        BLOOD CULTURE RECEIVED NO GROWTH TO DATE CULTURE WILL BE HELD FOR 5 DAYS BEFORE ISSUING A FINAL NEGATIVE REPORT   Report Status PENDING   Incomplete   GRAM STAIN     Status: Normal   Collection Time   06/06/12  5:12 PM      Component Value Range Status Comment   Specimen Description WOUND ASPIRATE NECK   Final    Special Requests NONE   Final    Gram Stain     Final    Value: ABUNDANT WBC PRESENT,BOTH PMN AND MONONUCLEAR     ABUNDANT GRAM POSITIVE COCCI IN PAIRS IN CHAINS   Report Status 06/06/2012 FINAL   Final   WOUND CULTURE     Status: Normal   Collection Time   06/06/12  5:12 PM      Component Value Range Status Comment   Specimen Description WOUND ASPIRATE NECK   Final    Special Requests NONE   Final    Gram Stain     Final    Value: ABUNDANT WBC PRESENT,BOTH PMN AND MONONUCLEAR     ABUNDANT GRAM POSITIVE COCCI IN PAIRS AND CHAINS     Performed at [facility]   Culture     Final    Value: MODERATE MICROAEROPHILIC STREPTOCOCCI     Note: Standardized susceptibility testing for this organism is not available.   Report Status 06/09/2012 FINAL   Final   ANAEROBIC CULTURE     Status: Normal (Preliminary result)   Collection Time   06/06/12  5:12 PM      Component Value Range Status Comment   Specimen Description WOUND ASPIRATE NECK   Final    Special Requests NONE   Final    Gram Stain     Final      Value: ABUNDANT WBC PRESENT,BOTH PMN AND MONONUCLEAR     ABUNDANT GRAM POSITIVE COCCI IN PAIRS AND CHAINS     Performed at [facility]   Culture     Final    Value: NO ANAEROBES ISOLATED; CULTURE IN PROGRESS FOR 5 DAYS   Report Status PENDING   Incomplete      Studies:  Recent x-ray studies have been reviewed in detail by the Attending Physician  Scheduled Meds:  Reviewed in detail by the Attending Physician  Virginia Rochester, MD [facility] [phone]  On-Call/Text Page:      [email or web address]      password [email or web address]  If 7PM-7AM, please contact night-coverage [email or web address] Password [email or web address] 06/10/2012, 5:21 PM   LOS: 5 days

## 2012-06-10 NOTE — Progress Notes (Signed)
PATIENT ID: Patrick Richards   3 Days Post-Op Procedure(s) (LRB): POSTERIOR CERVICAL FUSION/FORAMINOTOMY LEVEL 4 (N/A)  Subjective: Doing well. No pain. No difficulty swallowing or breathing. Now on ceftriaxone.  Objective:  Filed Vitals:   06/10/12 0600  BP: 148/82  Pulse: 90  Temp: 98.6 F (37 C)  Resp: 16    He speaks with a soft, somewhat raspy voice. Scant dry blood on dressing. Cervical collar intact. Good strength in bilateral upper and lower extremities.  Labs:   [facility] 06/10/12 0710 06/09/12 0430  HGB 10.0* 11.5*   Basename 06/10/12 0710 06/09/12 0430  WBC 14.2* 14.8*  RBC 3.35* 3.78*  HCT 29.8* 33.1*  PLT 305 296   Basename 06/10/12 0710 06/09/12 0430  NA 138 136  K 4.3 3.3*  CL 103 99  CO2 27 24  BUN 10 9  CREATININE 0.66 0.71  GLUCOSE 135* 141*  CALCIUM 8.5 8.9    Assessment and Plan: Doing well. WBC trending downward. Antibiotics per ID. Likely will need PICC line. Will get physical therapy by today to get him out of bed  Continue Aspen collar

## 2012-06-10 NOTE — Progress Notes (Signed)
INFECTIOUS DISEASE PROGRESS NOTE  ID: Patrick Richards is a 61 y.o. male with neck pain found to have  diskits and epidural abscessStatus post C4-C5 corpectomy with I&D and placement of anterior instrumentation C3-C6 on 06/07/2012 - ID suggest 8 weeks of parenteral antibiotic therapy followed by an extended course of oral antibiotics - wound cultures are now revealing microaerophilic strep   Subjective: Afebrile.  Abtx:  abtx day #4 ceftriaxone vanco d/c 8/10   Medications:     . cefTRIAXone (ROCEPHIN)  IV  2 g Intravenous Q24H  . DULoxetine  30 mg Oral Daily  . feeding supplement  1 Container Oral TID BM  . methocarbamol (ROBAXIN) IV  500 mg Intravenous Q6H  . oxyCODONE  10 mg Oral Q12H    Objective: Vital signs in last 24 hours: Temp:  [98.2 F (36.8 C)-99.8 F (37.7 C)] 98.6 F (37 C) (08/11 0600) Pulse Rate:  [77-102] 90  (08/11 0600) Resp:  [16-18] 16  (08/11 0600) BP: (129-148)/(62-86) 148/82 mmHg (08/11 0600) SpO2:  [96 %-100 %] 97 % (08/11 0600)  General: No acute respiratory distress - appears reasonably comfortable in cervical collar  Lungs: Clear to auscultation bilaterally without wheezes or crackles  Cardiovascular: Regular rate and rhythm without murmur gallop or rub  Abdomen: Nontender, nondistended, soft, bowel sounds positive, no rebound, no ascites, no appreciable mass  Extremities: No significant cyanosis, clubbing, or edema bilateral lower extremities    Lab Results  Basename 06/10/12 0710 06/09/12 0430  WBC 14.2* 14.8*  HGB 10.0* 11.5*  HCT 29.8* 33.1*  NA 138 136  K 4.3 3.3*  CL 103 99  CO2 27 24  BUN 10 9  CREATININE 0.66 0.71  GLU -- --   Microbiology: Recent Results (from the past 240 hour(s))  CULTURE, BLOOD (ROUTINE X 2)     Status: Normal (Preliminary result)   Collection Time   06/05/12  7:25 PM      Component Value Range Status Comment   Specimen Description BLOOD RIGHT ARM   Final    Special Requests BOTTLES DRAWN AEROBIC AND  ANAEROBIC 10CC   Final    Culture  Setup Time 06/06/2012 04:00   Final    Culture     Final    Value:        BLOOD CULTURE RECEIVED NO GROWTH TO DATE CULTURE WILL BE HELD FOR 5 DAYS BEFORE ISSUING A FINAL NEGATIVE REPORT   Report Status PENDING   Incomplete   CULTURE, BLOOD (ROUTINE X 2)     Status: Normal (Preliminary result)   Collection Time   06/05/12  7:34 PM      Component Value Range Status Comment   Specimen Description BLOOD RIGHT ARM   Final    Special Requests BOTTLES DRAWN AEROBIC AND ANAEROBIC 10CC   Final    Culture  Setup Time 06/06/2012 04:00   Final    Culture     Final    Value:        BLOOD CULTURE RECEIVED NO GROWTH TO DATE CULTURE WILL BE HELD FOR 5 DAYS BEFORE ISSUING A FINAL NEGATIVE REPORT   Report Status PENDING   Incomplete   GRAM STAIN     Status: Normal   Collection Time   06/06/12  5:12 PM      Component Value Range Status Comment   Specimen Description WOUND ASPIRATE NECK   Final    Special Requests NONE   Final    Gram Stain  Final    Value: ABUNDANT WBC PRESENT,BOTH PMN AND MONONUCLEAR     ABUNDANT GRAM POSITIVE COCCI IN PAIRS IN CHAINS   Report Status 06/06/2012 FINAL   Final   WOUND CULTURE     Status: Normal   Collection Time   06/06/12  5:12 PM      Component Value Range Status Comment   Specimen Description WOUND ASPIRATE NECK   Final    Special Requests NONE   Final    Gram Stain     Final    Value: ABUNDANT WBC PRESENT,BOTH PMN AND MONONUCLEAR     ABUNDANT GRAM POSITIVE COCCI IN PAIRS AND CHAINS     Performed at [facility]   Culture     Final    Value: MODERATE MICROAEROPHILIC STREPTOCOCCI     Note: Standardized susceptibility testing for this organism is not available.   Report Status 06/09/2012 FINAL   Final   ANAEROBIC CULTURE     Status: Normal (Preliminary result)   Collection Time   06/06/12  5:12 PM      Component Value Range Status Comment   Specimen Description WOUND ASPIRATE NECK   Final    Special Requests NONE   Final     Gram Stain     Final    Value: ABUNDANT WBC PRESENT,BOTH PMN AND MONONUCLEAR     ABUNDANT GRAM POSITIVE COCCI IN PAIRS AND CHAINS     Performed at [facility]   Culture     Final    Value: NO ANAEROBES ISOLATED; CULTURE IN PROGRESS FOR 5 DAYS   Report Status PENDING   Incomplete     Studies/Results: No results found.   Assessment/Plan: Diskitis/ epidural abscess 2/2 microaerophilic strep = recommend to treat for 8 wks with ceftriaxone 2gm IV daily. Use antibiotic start date as 06/07/2012. Thus end date is Oct 3rd, 2013. Will place order for picc line today. Will check esr and crp today as well for baseline markers  Follow up -> we will see the patient back in ID clinic in 3-4 wks and on Oct 3rd  Will sign off. If any questions today please contact me or on Monday, Dr. Daiva Eves.  [name], [name] Infectious Diseases 06/10/2012, 11:08 AM  [facility]. [name] MD MPH [facility] [phone] (pager) [phone] (cell)

## 2012-06-10 NOTE — Evaluation (Signed)
Physical Therapy Evaluation Patient Details Name: Patrick Richards MRN: [id number] DOB: 08-Sep-1951 Today's Date: 06/10/2012 Time: 6578-4696 PT Time Calculation (min): 30 min  PT Assessment / Plan / Recommendation Clinical Impression  Pt is a 61 y/o male s/p cervical foraminotomy with posterior cervical fusion and anterior decompression with discectomy.  Educated pt and spouse in cervical precautions and provided pt with educational material on his surgical proceedure.  Pt will be seen once more to review cervical precautions and further evaluate gait to determine need for assistive device.     PT Assessment  Patient needs continued PT services    Follow Up Recommendations  No PT follow up;Supervision - Intermittent    Barriers to Discharge None      Equipment Recommendations  None recommended by PT    Recommendations for Other Services     Frequency Min 5X/week    Precautions / Restrictions Precautions Precautions: Cervical Restrictions Weight Bearing Restrictions: No    Pertinent Vitals/Pain Pt reports 2-3/10 pain in neck. Pt medicated prior to session.        Mobility  Bed Mobility Bed Mobility: Rolling Right;Rolling Left;Right Sidelying to Sit;Sit to Sidelying Right Rolling Right: 5: Supervision Rolling Left: 5: Supervision Right Sidelying to Sit: 4: Min guard;HOB flat Sit to Sidelying Right: 4: Min guard;HOB flat Details for Bed Mobility Assistance: Instructed pt in techniques for bed mobility to minimize cervical strain and maintain cervical precautios.  Transfers Transfers: Sit to Stand;Stand to Sit Sit to Stand: 7: Independent Stand to Sit: 7: Independent Ambulation/Gait Ambulation/Gait Assistance: 5: Supervision Ambulation Distance (Feet): 300 Feet Assistive device: Rolling walker;None Ambulation/Gait Assistance Details: 250 with RW, 50 feet with no AD.   Gait Pattern: Within Functional Limits Stairs: No Wheelchair Mobility Wheelchair Mobility: No      Exercises     PT Diagnosis: Acute pain  PT Problem List: Decreased safety awareness;Decreased knowledge of precautions PT Treatment Interventions: Gait training;DME instruction;Stair training;Functional mobility training;Therapeutic activities;Patient/family education   PT Goals Acute Rehab PT Goals PT Goal Formulation: With patient/family Time For Goal Achievement: 06/12/12 Potential to Achieve Goals: Good Pt will Roll Supine to Right Side: Independently PT Goal: Rolling Supine to Right Side - Progress: Goal set today Pt will go Supine/Side to Sit: Independently PT Goal: Supine/Side to Sit - Progress: Goal set today Pt will go Sit to Supine/Side: Independently PT Goal: Sit to Supine/Side - Progress: Goal set today Pt will Ambulate: >150 feet;Independently PT Goal: Ambulate - Progress: Goal set today Pt will Go Up / Down Stairs: 1-2 stairs;with supervision PT Goal: Up/Down Stairs - Progress: Goal set today  Visit Information  Last PT Received On: 06/10/12    Subjective Data  Subjective: Agree to PT eval   Prior Functioning  Home Living Lives With: Son;Daughter;Spouse Available Help at Discharge: Family;Available 24 hours/day Type of Home: House Home Access: Stairs to enter [employer] of Steps: 1 Entrance Stairs-Rails: None Home Layout: One level Bathroom Shower/Tub: Forensic scientist: Standard Home Adaptive Equipment: Bedside commode/3-in-1;Walker - rolling Prior Function Level of Independence: Independent Able to Take Stairs?: Yes Driving: Yes Vocation: Full time employment Comments: Production assistant, radio Communication: No difficulties Dominant Hand: Right    Cognition  Overall Cognitive Status: Appears within functional limits for tasks assessed/performed Arousal/Alertness: Awake/alert Orientation Level: Appears intact for tasks assessed;Oriented X4 / Intact Behavior During Session: [facility] for tasks performed     Extremity/Trunk Assessment Right Upper Extremity Assessment RUE ROM/Strength/Tone: Within functional levels Left Upper Extremity Assessment LUE ROM/Strength/Tone: Within  functional levels Right Lower Extremity Assessment RLE ROM/Strength/Tone: Within functional levels Left Lower Extremity Assessment LLE ROM/Strength/Tone: Within functional levels Trunk Assessment Trunk Assessment: Normal   Balance Balance Balance Assessed: Yes Static Sitting Balance Static Sitting - Balance Support: Feet supported Static Sitting - Level of Assistance: 7: Independent Static Standing Balance Static Standing - Balance Support: No upper extremity supported Static Standing - Level of Assistance: 7: Independent  End of Session PT - End of Session Equipment Utilized During Treatment: Gait belt Activity Tolerance: Patient tolerated treatment well Patient left: in chair;with family/visitor present;with call bell/phone within reach Nurse Communication: Mobility status;Precautions  GP     Allisyn Kunz,Bonnie 06/10/2012, 11:41 AM Theron Arista L. [place] DPT [phone]

## 2012-06-11 ENCOUNTER — Inpatient Hospital Stay (HOSPITAL_COMMUNITY): Payer: Managed Care, Other (non HMO)

## 2012-06-11 LAB — BASIC METABOLIC PANEL
BUN: 7 mg/dL (ref 6–23)
Calcium: 8 mg/dL — ABNORMAL LOW (ref 8.4–10.5)
Chloride: 100 mEq/L (ref 96–112)
Creatinine, Ser: 0.71 mg/dL (ref 0.50–1.35)
GFR calc Af Amer: >90 mL/min (ref >90)
GFR calc non Af Amer: >90 mL/min (ref >90)

## 2012-06-11 LAB — ANAEROBIC CULTURE

## 2012-06-11 LAB — CBC
HCT: 26.6 % — ABNORMAL LOW (ref 39.0–52.0)
MCH: 30.6 pg (ref 26.0–34.0)
MCHC: 34.6 g/dL (ref 30.0–36.0)
MCV: 88.4 fL (ref 78.0–100.0)
Platelets: 292 10*3/uL (ref 150–400)
RDW: 13 % (ref 11.5–15.5)

## 2012-06-11 MED ORDER — OXYCODONE HCL 5 MG PO TABS: 5.0000 mg | ORAL_TABLET | ORAL | Status: AC | PRN

## 2012-06-11 MED ORDER — DEXTROSE 5 % IV SOLN: 2.0000 g | INTRAVENOUS | Status: DC

## 2012-06-11 MED ORDER — OXYCODONE HCL 10 MG PO TB12: 10.0000 mg | ORAL_TABLET | Freq: Two times a day (BID) | ORAL | Status: DC

## 2012-06-11 MED ORDER — ACETAMINOPHEN 325 MG PO TABS: 650.0000 mg | ORAL_TABLET | Freq: Four times a day (QID) | ORAL | Status: AC | PRN

## 2012-06-11 MED ORDER — ZOLPIDEM TARTRATE ER 6.25 MG PO TBCR: 6.2500 mg | EXTENDED_RELEASE_TABLET | Freq: Every evening | ORAL | Status: AC | PRN

## 2012-06-11 NOTE — Progress Notes (Signed)
Physical Therapy Treatment and Discharge Patient Details Name: Patrick Richards MRN: [id number] DOB: 10/24/1951 Today's Date: 06/11/2012 Time: 8295-6213 PT Time Calculation (min): 15 min  PT Assessment / Plan / Recommendation Comments on Treatment Session  Pt presents with no further acute PT needs. Reviewed cervical precautions, use of Cervical collar and importance of appropriate posture with pt and spouse.     Follow Up Recommendations  No PT follow up;Supervision - Intermittent    Barriers to Discharge        Equipment Recommendations  None recommended by PT    Recommendations for Other Services    Frequency Min 5X/week   Plan Discharge plan remains appropriate;All goals met and education completed, patient dischaged from PT services    Precautions / Restrictions Precautions Precautions: Cervical Restrictions Weight Bearing Restrictions: No   Pertinent Vitals/Pain No pain    Mobility  Bed Mobility Bed Mobility: Rolling Right;Rolling Left;Right Sidelying to Sit;Sit to Sidelying Right Rolling Right: 7: Independent Rolling Left: 7: Independent Right Sidelying to Sit: 7: Independent Sit to Sidelying Right: 7: Independent Transfers Transfers: Sit to Stand;Stand to Sit Sit to Stand: 7: Independent Stand to Sit: 7: Independent Ambulation/Gait Ambulation/Gait Assistance: Not tested (comment)    Exercises     PT Diagnosis:    PT Problem List:   PT Treatment Interventions:     PT Goals Acute Rehab PT Goals PT Goal Formulation: With patient/family Time For Goal Achievement: 06/12/12 Potential to Achieve Goals: Good Pt will Roll Supine to Right Side: Independently PT Goal: Rolling Supine to Right Side - Progress: Met Pt will go Supine/Side to Sit: Independently PT Goal: Supine/Side to Sit - Progress: Met Pt will go Sit to Supine/Side: Independently PT Goal: Sit to Supine/Side - Progress: Met Pt will Ambulate: >150 feet;Independently PT Goal: Ambulate - Progress:  Met  Visit Information  Last PT Received On: 06/11/12    Subjective Data  Subjective: I feel great no pain Patient Stated Goal: D/C  home today.    Cognition  Overall Cognitive Status: Appears within functional limits for tasks assessed/performed Arousal/Alertness: Awake/alert Orientation Level: Appears intact for tasks assessed;Oriented X4 / Intact Behavior During Session: [facility] for tasks performed    Balance     End of Session PT - End of Session Equipment Utilized During Treatment: Cervical collar Activity Tolerance: Patient tolerated treatment well Patient left: in chair;with family/visitor present;with call bell/phone within reach Nurse Communication: Mobility status;Precautions   GP     Patrick Richards,Danyl 06/11/2012, 11:02 AM Patrick Richards DPT [phone]

## 2012-06-11 NOTE — Progress Notes (Signed)
Utilization review completed. Azariya Freeman, RN, BSN. 

## 2012-06-11 NOTE — Progress Notes (Signed)
Patient feels well.  No neck pain.  BP 160/80  Pulse 85  Temp 98.1 F (36.7 C) (Oral)  Resp 16  Ht 6\' 1"  (1.854 m)  Wt 79.3 kg (174 lb 13.2 oz)  BMI 23.07 kg/m2  SpO2 100%  Patient looks extremely comfortable. Collar fits appropriately NVI Anterior dressing removed, wound CDI  Wbc count continues to trend down, currently at 9.0  Pt s/p A/P fusion for extensive discitis and osteomyelitis  - ID receommending 8 weeks of IV abx - from the standpoint of the patient spine, he will remain in hard cervical collar at all times - f/u my office in 2 weeks - wife instructed to remove posterior dressing tomorrow - philly collar to bedside for showering

## 2012-06-11 NOTE — Discharge Summary (Addendum)
Physician Discharge Summary  Patrick Richards ZOX:[id number] DOB: 05-13-1951 DOA: 06/05/2012  PCP: Sid Falcon, MD  Admit date: 06/05/2012 Discharge date: 06/11/2012  Recommendations for Outpatient Follow-up:  1. Patient will followup with spine surgery in 2 weeks 2. Home health will follow the patient for daily IV antibiotics for the next 2 months 3. Patient will follow up with infectious disease in 3 weeks and again in 2 months 4. Patient will need to remain in a hard neck collar until he is cleared by spine surgery  Discharge Diagnoses:  Principal Problem:  *Diskitis Active Problems:  Neck pain  Anxiety  Postoperative respiratory failure  Encephalopathy acute   Discharge Condition: Improved being discharged home  Diet recommendation: Full diet until patient is able to tolerate more solid food at his own accord  [employer]   06/06/12 1700 06/07/12 2205  Weight: 74.798 kg (164 lb 14.4 oz) 79.3 kg (174 lb 13.2 oz)    History of present illness:  Patient is a 61 year old white male with no past medical history who was seen by orthopedic surgery in the office for neck pain and found to have a prevertebral fluid collection on MRI of the cervical spine plus suspected secondary discitis. He had no other symptoms such as weakness or signs of toxicity.  Hospital Course:  C4-C5 discitis/osteomyelitis/prevertebral abscess: Being sent over by orthopedic surgery, patient was evaluated on the floor. He an extensive discussion between the hospitalist, orthopedic surgery and infectious disease. Because the patient was asymptomatic other than neck pain and was not in any imminent crisis, it was felt safe to hold antibiotics until fluid had been aspirated to properly guide antibiotic therapy. The patient went to the OR on 8/7 removal. Cultures were sent and the patient was started on broad-spectrum antibiotics. He went back to the OR on 8/8 for cervical fusion. After surgery the patient remained  intubated and was moved to the critical care unit. Critical care medicine followed the patient and was preparing him for extubation on 8/9 and the patient self extubated. He was stable and was able to be moved to the step down unit the following day. He transitioned well and was able to be moved to the floor by 8/11. Patient's cultures came back positive for microaerophilic strep and he was able to have his antibiotic coverage narrowed to Rocephin 2 g IV daily. By day of discharge, his white count had normalized. The plan will be as per infectious disease recommendation to continue the Zyvox for IV for 2 months and then changed to oral antibiotics. The patient will remain on a hard collar until he is cleared by spinal surgery. Neck pain: Secondary to #1. Pain is better controlled. Patient will go home on extended and instant release OxyContin Sleep disturbance: Respond well to when necessary Ambien. Patient going home with a prescription for this.  Procedures: C4-C5 corpectomy with I&D and placement of anterior instrumentation C3-C6 on 06/07/2012  -Self extubation 06/08/2012 -Placement of PICC line 06/11/12 and a   Consultations:  Estill Bamberg, orthopedic surgery  Pervis Hocking, infectious disease  Kalman Shan, critical care medicine  Discharge Exam: Filed Vitals:   06/11/12 1419  BP: 156/89  Pulse: 87  Temp: 98.1 F (36.7 C)  Resp: 16   Filed Vitals:   06/10/12 2151 06/11/12 0540 06/11/12 0602 06/11/12 1419  BP: 141/64 172/91 160/80 156/89  Pulse: 90 85  87  Temp: 98.2 F (36.8 C) 98.1 F (36.7 C)  98.1 F (36.7 C)  TempSrc: Oral Oral  Oral  Resp: 18 16  16   Height:      Weight:      SpO2: 99% 100%  100%    General: No acute respiratory distress - appears reasonably comfortable in cervical collar  Lungs: Clear to auscultation bilaterally without wheezes or crackles  Cardiovascular: Regular rate and rhythm without murmur gallop or rub  Abdomen: Nontender,  nondistended, soft, bowel sounds positive, no rebound, no ascites, no appreciable mass  Extremities: No significant cyanosis, clubbing, or edema bilateral lower extremities   Discharge Instructions  Discharge Orders    Future Appointments: Provider: Department: Dept Phone: Center:   07/04/2012 2:00 PM Judyann Munson, MD [facility] [phone] [facility]   08/02/2012 2:00 PM Randall Hiss, MD [facility] [phone] [facility]     Medication List  As of 06/11/2012  2:56 PM   STOP taking these medications         HYDROcodone-acetaminophen 5-325 MG per tablet         TAKE these medications         acetaminophen 325 MG tablet   Commonly known as: TYLENOL   Take 2 tablets (650 mg total) by mouth every 6 (six) hours as needed (or Fever >/= 101).      dextrose 5 % SOLN 50 mL with cefTRIAXone 2 G SOLR 2 g   Inject 2 g into the vein daily.      DULoxetine 30 MG capsule   Commonly known as: CYMBALTA   Take 30 mg by mouth daily.      oxyCODONE 10 MG 12 hr tablet   Commonly known as: OXYCONTIN   Take 1 tablet (10 mg total) by mouth every 12 (twelve) hours.      oxyCODONE 5 MG immediate release tablet   Commonly known as: Oxy IR/ROXICODONE   Take 1 tablet (5 mg total) by mouth every 3 (three) hours as needed.      zolpidem 6.25 MG CR tablet   Commonly known as: AMBIEN CR   Take 1 tablet (6.25 mg total) by mouth at bedtime as needed for sleep.           Follow-up Information    Follow up with Acey Lav, MD. Schedule an appointment as soon as possible for a visit in 3 weeks.   Contact information:   [address] [place] [address] [phone]       Follow up with Acey Lav, MD. Schedule an appointment as soon as possible for a visit in 2 months.   Contact information:   [address] [place] [address] [phone]       Follow up with Sid Falcon, MD  in 1 month.   Contact information:   [address] [place] [address] [phone]       Follow up with Emilee Hero, MD. Schedule an appointment as soon as possible for a visit in 2 weeks.   Contact information:   [facility] [address] [place] [address] [phone]           The results of significant diagnostics from this hospitalization (including imaging, microbiology, ancillary and laboratory) are listed below for reference.    Significant Diagnostic Studies: Dg Cervical Spine 1 View 06/07/2012   IMPRESSION:  1.  Probable postoperative prevertebral hematoma. 2.  Anterior fusion  from C3-C6 with normal alignment.  Original Report Authenticated By: Juline Patch, M.D.   Dg Cervical Spine 2-3 Views 06/11/2012    IMPRESSION:  Postoperative changes as described.  Original Report Authenticated By: Elsie Stain, M.D.   Dg Cervical Spine 2-3 Views 06/07/2012  IMPRESSION: 1.  Extensive postoperative changes of the cervical spinal fusion, as detailed above.  Prevertebral soft tissue swelling is persistent, but appears slightly decreased.  Whether or not this is because of resolving postoperative hemorrhage/edema, or simply because of the presence of the indwelling endotracheal tube is uncertain.  Original Report Authenticated By: Florencia Reasons, M.D.   Dg Cervical Spine 2-3 Views 06/07/2012  IMPRESSION: 1.  Intraoperative documentation of posterior cervical fusion, as above.  Original Report Authenticated By: Florencia Reasons, M.D.   Dg Cervical Spine 2-3 Views 06/06/2012  IMPRESSION: C4-5 corpectomy with plate screw fixation from C3-C6.  Expected postoperative appearance and normal alignment  Original Report Authenticated By: Judie Petit. Ruel Favors, M.D.   Ct Cervical Spine Wo Contrast 06/06/2012   IMPRESSION:  1.  No gross change in the findings at C4-C5 concerning for diskitis, osteomyelitis and  prevertebral abscess formation as correlated with MRI performed yesterday. 2.  Of note, paravertebral and epidural soft tissue evaluation is limited by noncontrast CT.  Original Report Authenticated By: Gerrianne Scale, M.D.   Mr Cervical Spine Wo Contrast he  06/05/2012   IMPRESSION:  1.  Findings consistent with diskitis/osteomyelitis at C4-5.  No epidural abscess is identified although there is a prevertebral abscess as described above. 2.  Degenerative disease as described above.  Critical Value/emergent results were called by telephone at the time of interpretation on 06/05/2012 at 2:10 p.m. to Dr. Renae Fickle, who verbally acknowledged these results.  Original Report Authenticated By: Bernadene Bell. Maricela Curet, M.D.      Microbiology:  Blood cultures: No growth to date  WOUND CULTURE     Status: Normal   Collection Time   06/06/12  5:12 PM      Component Value Range Status Comment   Specimen Description WOUND ASPIRATE NECK   Final    Special Requests NONE   Final    Gram Stain     Final    Value: ABUNDANT WBC PRESENT,BOTH PMN AND MONONUCLEAR     ABUNDANT GRAM POSITIVE COCCI IN PAIRS AND CHAINS     Performed at [facility]   Culture     Final    Value: MODERATE MICROAEROPHILIC STREPTOCOCCI     Note: Standardized susceptibility testing for this organism is not available.   Report Status 06/09/2012 FINAL   Final   ANAEROBIC CULTURE     Status: Normal   Collection Time   06/06/12  5:12 PM      Component Value Range Status Comment   Specimen Description WOUND ASPIRATE NECK   Final    Special Requests NONE   Final    Gram Stain     Final    Value: ABUNDANT WBC PRESENT,BOTH PMN AND MONONUCLEAR     ABUNDANT GRAM POSITIVE COCCI IN PAIRS AND CHAINS     Performed at [facility]   Culture NO ANAEROBES ISOLATED   Final    Report Status 06/11/2012 FINAL   Final      Labs: Basic Metabolic Panel:  Lab 06/11/12 0865 06/10/12 0710 06/09/12 0430 06/06/12 0550  NA 134* 138 136 140  K  3.6 4.3 3.3* 4.0  CL 100 103 99 98  CO2 26  27 24 31   GLUCOSE 106* 135* 141* 102*  BUN 7 10 9 17   CREATININE 0.71 0.66 0.71 0.85  CALCIUM 8.0* 8.5 8.9 9.2  MG -- -- 2.2 --  PHOS -- -- 2.3 --   CBC:  Lab 06/11/12 0514 06/10/12 0710 06/09/12 0430 06/06/12 0550 06/05/12 1956  WBC 9.0 14.2* 14.8* 11.8* 11.3*  NEUTROABS -- -- -- -- --  HGB 9.2* 10.0* 11.5* 12.8* 13.4  HCT 26.6* 29.8* 33.1* 36.9* 37.9*  MCV 88.4 89.0 87.6 88.9 87.7  PLT 292 305 296 351 378    Time coordinating discharge: 45 minutes  Signed:  Hollice Espy  [facility] 06/11/2012, 2:56 PM

## 2012-06-12 LAB — CULTURE, BLOOD (ROUTINE X 2)
Culture: NO GROWTH
Culture: NO GROWTH

## 2012-07-04 ENCOUNTER — Encounter: Payer: Self-pay | Admitting: Internal Medicine

## 2012-07-04 ENCOUNTER — Ambulatory Visit (INDEPENDENT_AMBULATORY_CARE_PROVIDER_SITE_OTHER): Payer: Managed Care, Other (non HMO) | Admitting: Internal Medicine

## 2012-07-04 VITALS — BP 151/92 | HR 78 | Temp 97.7°F | Wt 174.0 lb

## 2012-07-04 DIAGNOSIS — M4640 Discitis, unspecified, site unspecified: Secondary | ICD-10-CM

## 2012-07-04 DIAGNOSIS — M519 Unspecified thoracic, thoracolumbar and lumbosacral intervertebral disc disorder: Secondary | ICD-10-CM

## 2012-07-04 NOTE — Progress Notes (Signed)
INFECTIOUS DISEASES CLINIC NOTE  RFV: diskitis and epidural abscess  Subjective:    Patient ID: Patrick Richards, male    DOB: [birth year], 61 y.o.   MRN: [id number]  HPI Patrick Richards is a 61 y.o. male with neck pain found to have diskits and epidural abscess.Status post C4-C5 corpectomy with I&D and placement of anterior instrumentation C3-C6 on 06/07/2012 - ID suggest 8 weeks of ceftriaxone 2gm IV daily followed by an extended course of oral antibiotics - wound cultures are now revealing microaerophilic strep.No longer has neck pain since surgery. Doing well with abtx, no redness, no rash, no pain at picc line, no diarrhea. All = nkma    Current Outpatient Prescriptions on File Prior to Visit  Medication Sig Dispense Refill  . dextrose 5 % SOLN 50 mL with cefTRIAXone 2 G SOLR 2 g Inject 2 g into the vein daily.  56 Package  0  . DULoxetine (CYMBALTA) 30 MG capsule Take 30 mg by mouth daily.      Marland Kitchen zolpidem (AMBIEN CR) 6.25 MG CR tablet Take 1 tablet (6.25 mg total) by mouth at bedtime as needed for sleep.  30 tablet  0  . acetaminophen (TYLENOL) 325 MG tablet Take 2 tablets (650 mg total) by mouth every 6 (six) hours as needed (or Fever >/= 101).      Marland Kitchen oxyCODONE (OXYCONTIN) 10 MG 12 hr tablet Take 1 tablet (10 mg total) by mouth every 12 (twelve) hours.  90 tablet  0     Active Ambulatory Problems    Diagnosis Date Noted  . Neck pain 06/05/2012  . Anxiety 06/05/2012  . Diskitis 06/05/2012  . Postoperative respiratory failure 06/07/2012  . Encephalopathy acute 06/07/2012   Resolved Ambulatory Problems    Diagnosis Date Noted  . 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 wkd drinking of beer in the past. Working out.   Family hx = cancer? Lung cancer  Review of Systems  Constitutional: Negative for fever, chills, diaphoresis, activity change,  appetite change, fatigue and unexpected weight change.  HENT: Negative for congestion, sore throat, rhinorrhea, sneezing, trouble swallowing and sinus pressure.  Eyes: Negative for photophobia and visual disturbance.  Respiratory: Negative for cough, chest tightness, shortness of breath, wheezing and stridor.  Cardiovascular: Negative for chest pain, palpitations and leg swelling.  Gastrointestinal: Negative for nausea, vomiting, abdominal pain, diarrhea, constipation, blood in stool, abdominal distention and anal bleeding.  Genitourinary: Negative for dysuria, hematuria, flank pain and difficulty urinating.  Musculoskeletal: Negative for myalgias, back pain, joint swelling, arthralgias and gait problem.  Skin: Negative for color change, pallor, rash and wound.  Neurological: Negative for dizziness, tremors, weakness and light-headedness.  Hematological: Negative for adenopathy. Does not bruise/bleed easily.  Psychiatric/Behavioral: Negative for behavioral problems, confusion, sleep disturbance, dysphoric mood, decreased concentration and agitation.       Objective:   Physical Exam  BP 151/92  Pulse 78  Temp 97.7 F (36.5 C) (Oral)  Wt 174 lb (78.926 kg) Physical Exam  Constitutional: He is oriented to person, place, and time. He appears well-developed and well-nourished. No distress.  HENT: [place]/AT, perrla, EOMI Mouth/Throat: Oropharynx is clear and moist. No oropharyngeal exudate.  Cardiovascular: Normal rate, regular rhythm and normal heart sounds. Exam reveals no gallop and no friction rub.  No murmur heard.  Pulmonary/Chest: Effort normal and breath sounds normal. No respiratory distress. He  has no wheezes.  Abdominal: Soft. Bowel sounds are normal. He exhibits no distension. There is no tenderness.  Lymphadenopathy:  He has no cervical adenopathy.  Neurological: He is alert and oriented to person, place, and time.  Skin: Skin is warm and dry. No rash noted. No erythema.    Psychiatric: He has a normal mood and affect. His behavior is normal.          Assessment & Plan:   microaerophilic strep diskitis and epidural abscess Status post C4-C5 corpectomy = continue with 8 wks plan of ceftriaxone 2gm IV daily. Will plan to switch to oral regimen at end of course of therapy with keflex. Instructed patient to call us if any rash develops, pain at picc line site, or diarrhea.  rtc in 4 wks   [facility] CC:Dr. Tawny Hopping orthopedics

## 2012-08-02 ENCOUNTER — Encounter: Payer: Self-pay | Admitting: Infectious Diseases

## 2012-08-02 ENCOUNTER — Inpatient Hospital Stay: Payer: Managed Care, Other (non HMO) | Admitting: Infectious Disease

## 2012-08-02 ENCOUNTER — Ambulatory Visit (INDEPENDENT_AMBULATORY_CARE_PROVIDER_SITE_OTHER): Payer: Managed Care, Other (non HMO) | Admitting: Infectious Diseases

## 2012-08-02 VITALS — Temp 97.7°F | Wt 175.0 lb

## 2012-08-02 DIAGNOSIS — M519 Unspecified thoracic, thoracolumbar and lumbosacral intervertebral disc disorder: Secondary | ICD-10-CM

## 2012-08-02 DIAGNOSIS — M4640 Discitis, unspecified, site unspecified: Secondary | ICD-10-CM

## 2012-08-02 LAB — CBC
HCT: 40.9 % (ref 39.0–52.0)
MCHC: 34.2 g/dL (ref 30.0–36.0)
Platelets: 295 10*3/uL (ref 150–400)
RDW: 14.7 % (ref 11.5–15.5)
WBC: 3 10*3/uL — ABNORMAL LOW (ref 4.0–10.5)

## 2012-08-02 LAB — SEDIMENTATION RATE: Sed Rate: 1 mm/hr (ref 0–16)

## 2012-08-02 MED ORDER — CEPHALEXIN 500 MG PO CAPS: 500.0000 mg | ORAL_CAPSULE | Freq: Two times a day (BID) | ORAL | Status: AC

## 2012-08-02 NOTE — Progress Notes (Signed)
  Subjective:    Patient ID: Kree Rafter, male    DOB: May 28, 1951, 61 y.o.   MRN: [id number]  HPI 61 y.o. M with neck pain found to have diskits and epidural abscess on 8-6.Status post C4-C5 corpectomy with I&D and placement of anterior instrumentation C3-C6 on 06/07/2012. He was started on 8 weeks of ceftriaxone 2gm qday. His wound cultures grew microaerophilic strep.  Today is without complaints. No fever or chills. Denies rash or diarrhea from anbx. No problems with PIC- no erythema, d/c, pain.  Has had f/u with Dr Yevette Edwards, and told it looks good.   Review of Systems  Constitutional: Negative for appetite change and unexpected weight change.  Gastrointestinal: Negative for diarrhea and constipation.  Genitourinary: Negative for dysuria.       Objective:   Physical Exam  Constitutional: He appears well-developed and well-nourished.  HENT:  Mouth/Throat: No oropharyngeal exudate.  Eyes: EOM are normal.  Neck: Neck supple.       Anterior and posterior scars are well healed. There is no tenderness, no fluctuance.   Cardiovascular: Normal rate, regular rhythm and normal heart sounds.   Pulmonary/Chest: Effort normal and breath sounds normal.  Abdominal: Soft. Bowel sounds are normal. There is no tenderness.  Musculoskeletal: He exhibits no edema.       RUE PIC is clean, non-tender, no d/c, no erythema          Assessment & Plan:

## 2012-08-02 NOTE — Addendum Note (Signed)
Addended by: Clent Damore C on: 08/02/2012 10:01 AM   Modules accepted: Orders

## 2012-08-02 NOTE — Progress Notes (Signed)
RN received verbal order to discontinue the patient's PICC line.  Patient identified with name and date of birth. PICC dressing removed, site unremarkable.  PICC line removed using sterile procedure @ 1030. PICC length equal to that noted in patient's hospital chart of 41 cm. Sterile petroleum gauze + sterile 4X4 applied to PICC site, pressure applied for 10 minutes and covered with Medipore tape as a pressure dressing. Patient tolerated procedure without complaints.  Patient instructed to limit use of arm for 1 hour. Patient instructed that the pressure dressing should remain in place for 24 hours. Patient verbalized understanding of these instructions.

## 2012-08-02 NOTE — Assessment & Plan Note (Signed)
He appears to be doing well. Will pull his PIC, change him to po keflex. Warned him about dysgeusia with this. Will check esr, crp, cbc today. Will see him back in 4 months.

## 2012-08-09 ENCOUNTER — Ambulatory Visit: Payer: Managed Care, Other (non HMO) | Admitting: Internal Medicine

## 2012-10-31 IMAGING — CR DG CERVICAL SPINE 2 OR 3 VIEWS
2 series · 2 of 2 positions shown · non-contrast
Comparison: None.

CLINICAL DATA: Postop fusion

CERVICAL SPINE - 2-3 VIEW

[w c-spine lat]
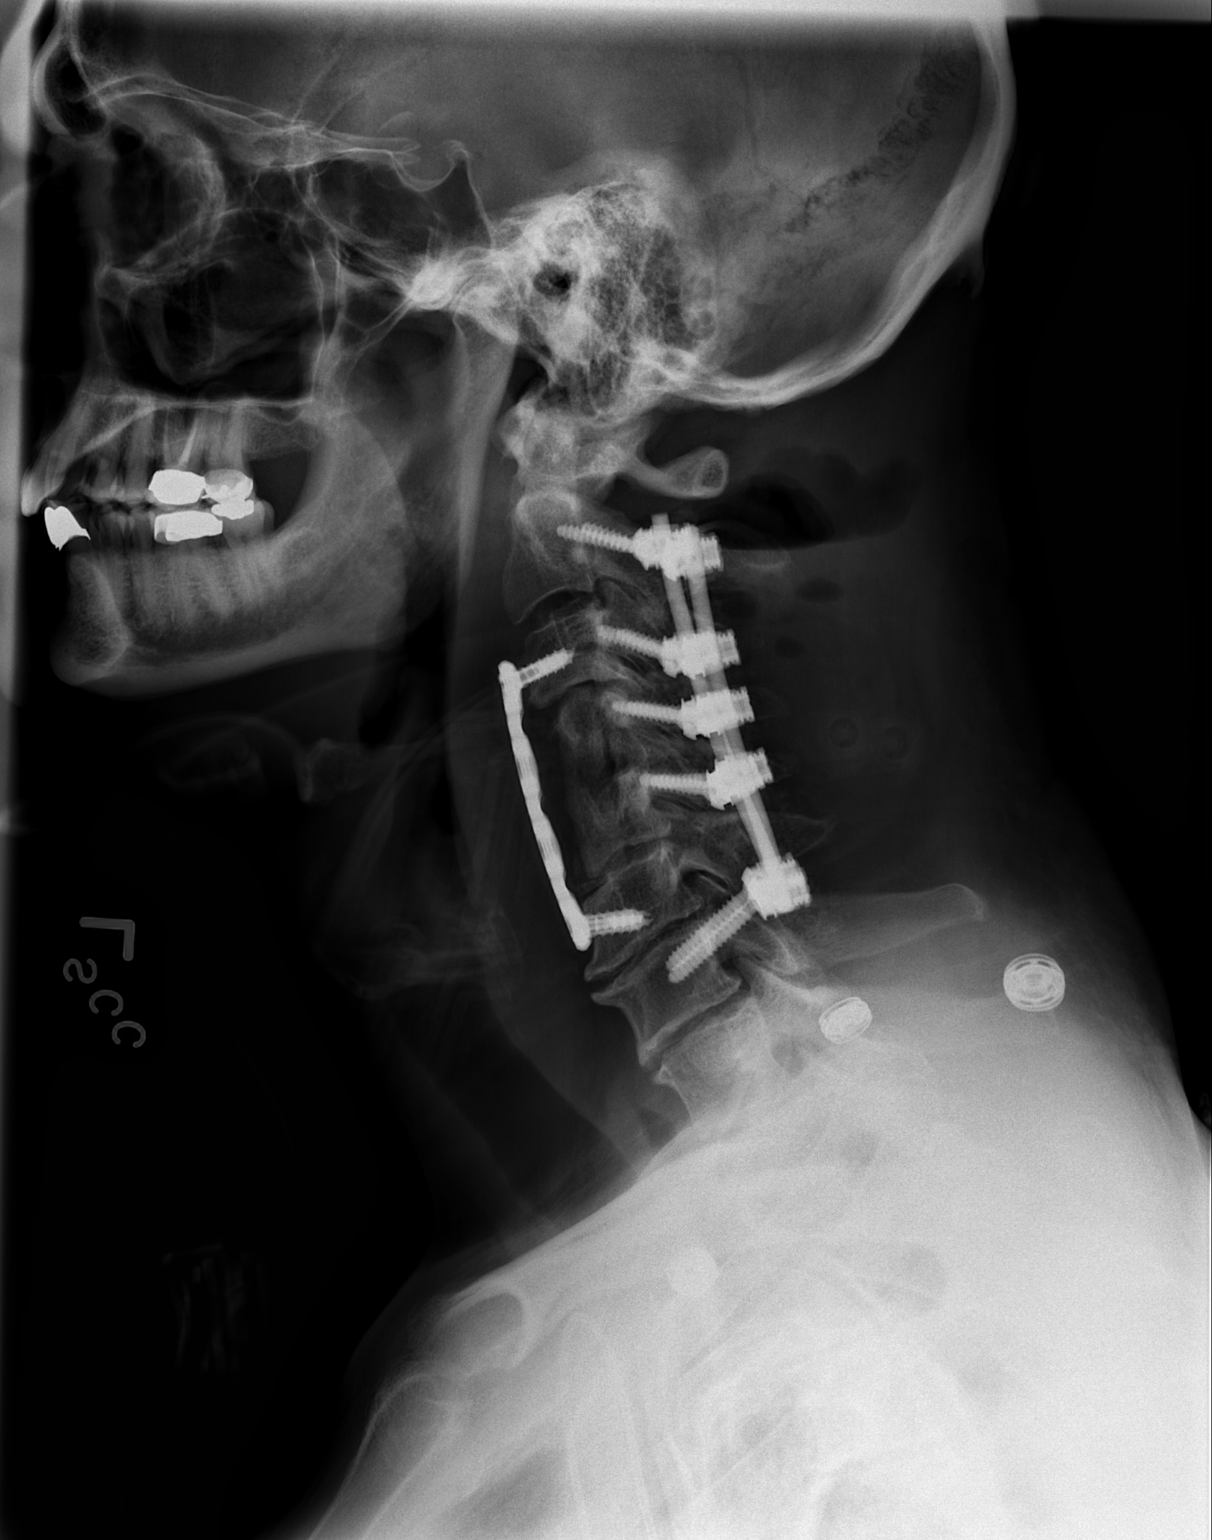

[w c-spine a.p.]
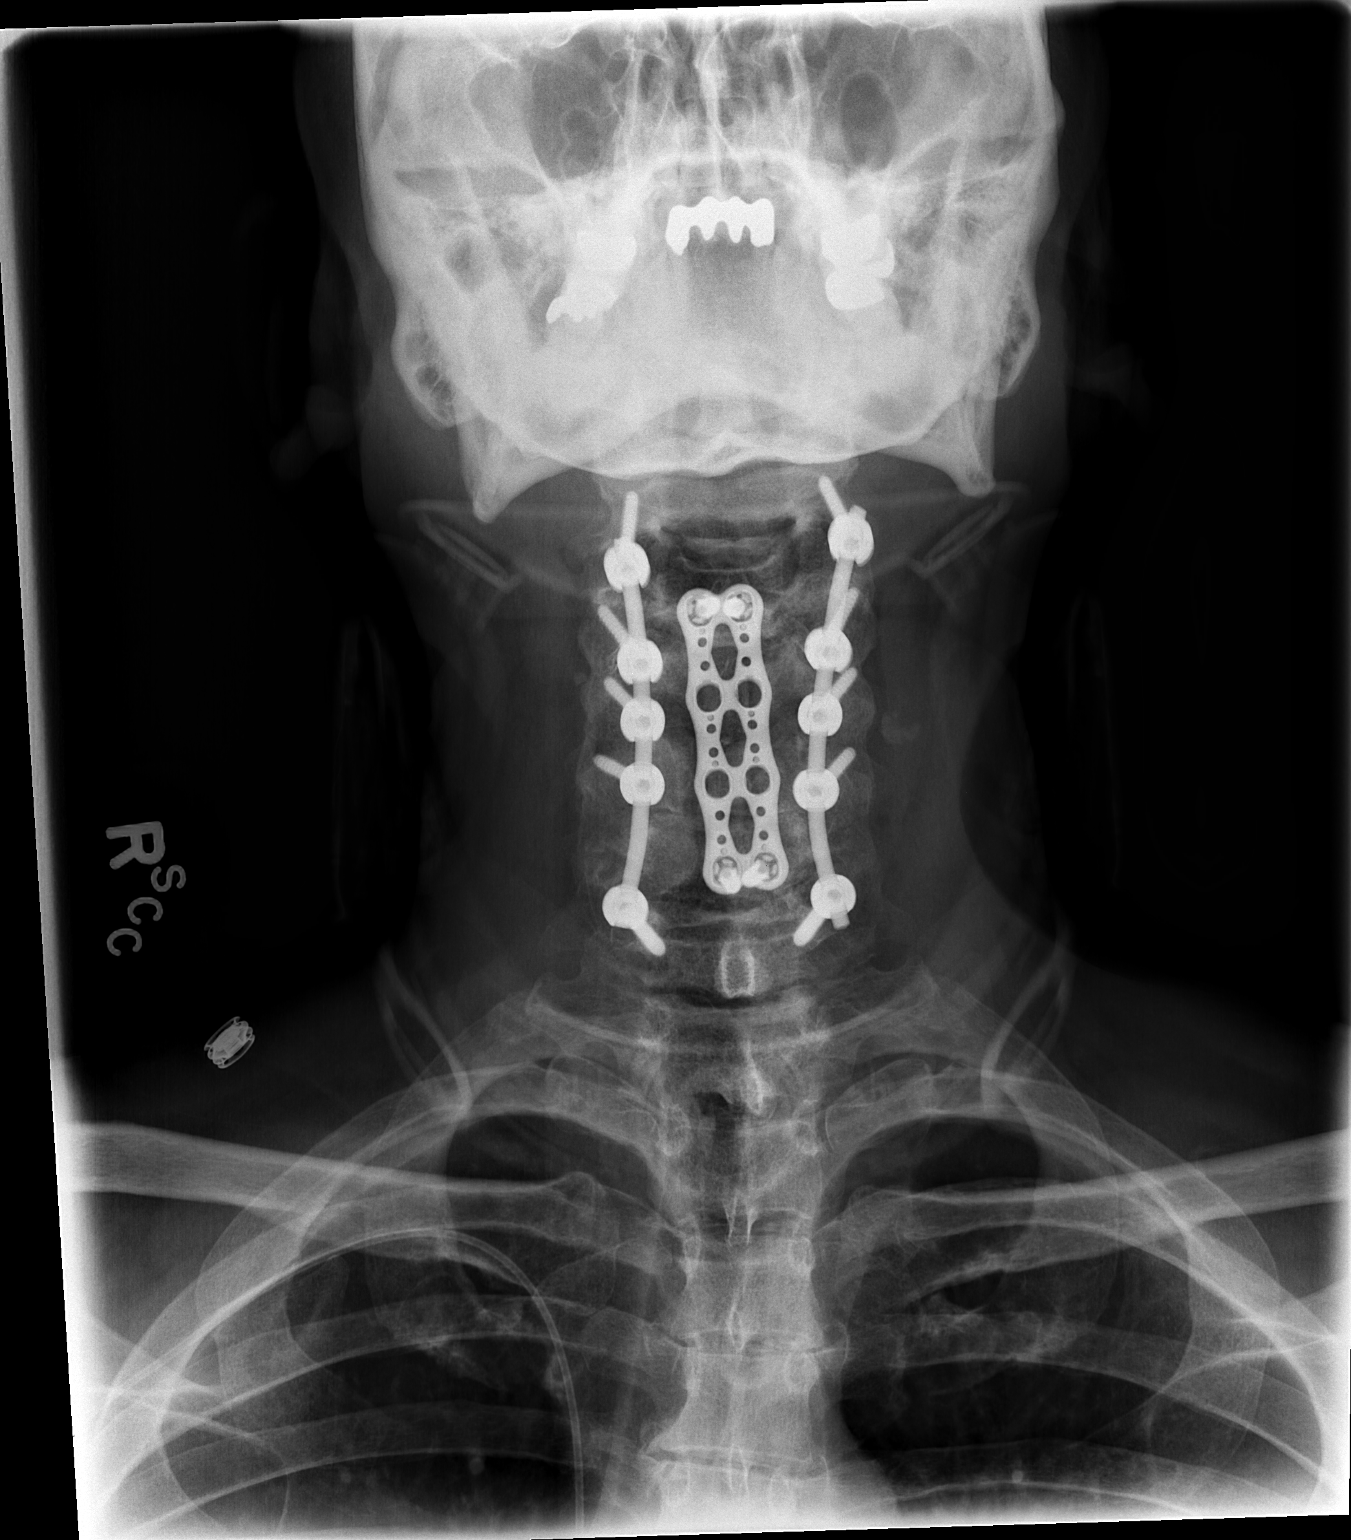

[2 of 2 positions shown; findings below may reference images not displayed]

FINDINGS: The patient is status post partial C4 and C5 corpectomy
with anterior  strut graft between C3 and C6 stabilized with C3 and
C6 screws connected to an anterior plate.  There is marked anterior
soft tissue swelling.

There has also been posterior cervical fusion from a separate
approach from C2-C7.  Marked air is noted in the posterior soft
tissues.  Slight lucency on the lateral view surrounds the C6 screw
or screws anteriorly of uncertain significance.  Disc space
narrowing at C6-C7.
IMPRESSION: Postoperative changes as described.

## 2012-12-31 ENCOUNTER — Ambulatory Visit (INDEPENDENT_AMBULATORY_CARE_PROVIDER_SITE_OTHER): Payer: Managed Care, Other (non HMO) | Admitting: Infectious Diseases

## 2012-12-31 ENCOUNTER — Encounter: Payer: Self-pay | Admitting: Infectious Diseases

## 2012-12-31 VITALS — BP 131/85 | HR 70 | Temp 98.1°F | Ht 72.0 in | Wt 171.0 lb

## 2012-12-31 DIAGNOSIS — M4640 Discitis, unspecified, site unspecified: Secondary | ICD-10-CM

## 2012-12-31 DIAGNOSIS — J329 Chronic sinusitis, unspecified: Secondary | ICD-10-CM | POA: Insufficient documentation

## 2012-12-31 DIAGNOSIS — M519 Unspecified thoracic, thoracolumbar and lumbosacral intervertebral disc disorder: Secondary | ICD-10-CM

## 2012-12-31 NOTE — Progress Notes (Signed)
  Subjective:    Patient ID: Patrick Richards, male    DOB: May 01, 1951, 62 y.o.   MRN: [id number]  HPI 62 y.o. M with neck pain found to have diskits and epidural abscess on 8-6.Status post C4-C5 corpectomy with I&D and placement of anterior instrumentation C3-C6 on 06/07/2012. He was started on 8 weeks of ceftriaxone 2gm qday. His wound cultures grew microaerophilic strep. He completed this, PIC pulled and was changed to keflex on 08-02-12      ESR    CRP 06-10-12 71 13.4 08-02-12 1 < .5   No problems with his neck, "better than it was". No pain. Very good ROM. Still taking keflex. No problems.    Review of Systems  Constitutional: Negative for fever, chills, appetite change and unexpected weight change.  Gastrointestinal: Negative for diarrhea and constipation.  Genitourinary: Negative for dysuria.       Objective:   Physical Exam  Constitutional: He appears well-developed and well-nourished.  Neck: Neck supple.    Lymphadenopathy:    He has no cervical adenopathy.          Assessment & Plan:

## 2012-12-31 NOTE — Assessment & Plan Note (Signed)
He c/o sinus drainage, cough, waking up with sore throat. i suggested he could try OTC's such as Ocean Spray nasal spray, claritin or zyrtec.

## 2012-12-31 NOTE — Assessment & Plan Note (Signed)
He is doing very well. Will continue his anbx to complete 1 year of therapy (stop 05-31-13). He and his wife asked how to know if infection was treated, i suggested we could repeat his CRP and ESR, they defered. I reassured them that he is doing very well, am available if any problems or concerns. Will see him back prn.

## 2013-11-25 ENCOUNTER — Telehealth: Payer: Self-pay | Admitting: *Deleted

## 2013-11-25 NOTE — Telephone Encounter (Signed)
Patient's wife called stating he is having fever 102 and cough. Wanted to know if it is possible for the diskitis to occur. Told her it was, but he should first be checked by his primary care for the flu. She said they changed insurance and he has not established anywhere yet. She will arrange an appt with a PCP. She will also follow up with his surgeon. Patrick MolaJacqueline Edith Groleau
